# Patient Record
Sex: Female | Born: 1945 | Race: Black or African American | Hispanic: No | Smoking: Never smoker
Health system: Southern US, Community
[De-identification: ages and names within clinical notes are randomized; demographics above are authoritative.]

## PROBLEM LIST (undated history)

## (undated) DIAGNOSIS — J45909 Unspecified asthma, uncomplicated: Secondary | ICD-10-CM

## (undated) DIAGNOSIS — H409 Unspecified glaucoma: Secondary | ICD-10-CM

## (undated) DIAGNOSIS — T7840XA Allergy, unspecified, initial encounter: Secondary | ICD-10-CM

## (undated) DIAGNOSIS — I10 Essential (primary) hypertension: Secondary | ICD-10-CM

## (undated) DIAGNOSIS — E119 Type 2 diabetes mellitus without complications: Secondary | ICD-10-CM

## (undated) DIAGNOSIS — F419 Anxiety disorder, unspecified: Secondary | ICD-10-CM

## (undated) DIAGNOSIS — K219 Gastro-esophageal reflux disease without esophagitis: Secondary | ICD-10-CM

## (undated) HISTORY — DX: Gastro-esophageal reflux disease without esophagitis: K21.9

## (undated) HISTORY — PX: ABDOMINAL HYSTERECTOMY: SHX81

## (undated) HISTORY — DX: Unspecified glaucoma: H40.9

## (undated) HISTORY — PX: TUBAL LIGATION: SHX77

## (undated) HISTORY — DX: Anxiety disorder, unspecified: F41.9

## (undated) HISTORY — DX: Essential (primary) hypertension: I10

## (undated) HISTORY — DX: Allergy, unspecified, initial encounter: T78.40XA

## (undated) HISTORY — DX: Unspecified asthma, uncomplicated: J45.909

---

## 1997-10-19 ENCOUNTER — Ambulatory Visit (HOSPITAL_COMMUNITY): Admission: RE | Admit: 1997-10-19 | Discharge: 1997-10-19 | Payer: Self-pay | Admitting: Internal Medicine

## 1998-02-07 ENCOUNTER — Ambulatory Visit (HOSPITAL_COMMUNITY): Admission: RE | Admit: 1998-02-07 | Discharge: 1998-02-07 | Payer: Self-pay | Admitting: Internal Medicine

## 1998-08-24 ENCOUNTER — Other Ambulatory Visit: Admission: RE | Admit: 1998-08-24 | Discharge: 1998-08-24 | Payer: Self-pay | Admitting: Internal Medicine

## 1998-11-09 ENCOUNTER — Ambulatory Visit (HOSPITAL_COMMUNITY): Admission: RE | Admit: 1998-11-09 | Discharge: 1998-11-09 | Payer: Self-pay | Admitting: Gastroenterology

## 1998-12-01 ENCOUNTER — Ambulatory Visit (HOSPITAL_COMMUNITY): Admission: RE | Admit: 1998-12-01 | Discharge: 1998-12-01 | Payer: Self-pay | Admitting: Gastroenterology

## 1998-12-01 ENCOUNTER — Encounter: Payer: Self-pay | Admitting: Gastroenterology

## 1998-12-05 ENCOUNTER — Encounter: Payer: Self-pay | Admitting: Gastroenterology

## 1998-12-05 ENCOUNTER — Ambulatory Visit (HOSPITAL_COMMUNITY): Admission: RE | Admit: 1998-12-05 | Discharge: 1998-12-05 | Payer: Self-pay | Admitting: Gastroenterology

## 1998-12-11 ENCOUNTER — Ambulatory Visit (HOSPITAL_COMMUNITY): Admission: RE | Admit: 1998-12-11 | Discharge: 1998-12-11 | Payer: Self-pay | Admitting: Gastroenterology

## 1998-12-11 ENCOUNTER — Encounter: Payer: Self-pay | Admitting: Gastroenterology

## 1999-06-06 ENCOUNTER — Ambulatory Visit (HOSPITAL_COMMUNITY): Admission: RE | Admit: 1999-06-06 | Discharge: 1999-06-06 | Payer: Self-pay | Admitting: Gastroenterology

## 1999-06-06 ENCOUNTER — Encounter: Payer: Self-pay | Admitting: Gastroenterology

## 2000-01-10 ENCOUNTER — Emergency Department (HOSPITAL_COMMUNITY): Admission: EM | Admit: 2000-01-10 | Discharge: 2000-01-10 | Payer: Self-pay | Admitting: Emergency Medicine

## 2000-01-10 ENCOUNTER — Encounter: Payer: Self-pay | Admitting: Emergency Medicine

## 2000-06-07 ENCOUNTER — Ambulatory Visit (HOSPITAL_COMMUNITY): Admission: RE | Admit: 2000-06-07 | Discharge: 2000-06-07 | Payer: Self-pay | Admitting: Internal Medicine

## 2000-06-07 ENCOUNTER — Encounter: Payer: Self-pay | Admitting: Internal Medicine

## 2001-06-02 ENCOUNTER — Ambulatory Visit (HOSPITAL_COMMUNITY): Admission: RE | Admit: 2001-06-02 | Discharge: 2001-06-02 | Payer: Self-pay | Admitting: Gastroenterology

## 2001-06-02 ENCOUNTER — Encounter: Payer: Self-pay | Admitting: Gastroenterology

## 2001-12-25 ENCOUNTER — Emergency Department (HOSPITAL_COMMUNITY): Admission: EM | Admit: 2001-12-25 | Discharge: 2001-12-26 | Payer: Self-pay | Admitting: Emergency Medicine

## 2002-06-13 ENCOUNTER — Emergency Department (HOSPITAL_COMMUNITY): Admission: EM | Admit: 2002-06-13 | Discharge: 2002-06-13 | Payer: Self-pay | Admitting: Emergency Medicine

## 2002-06-18 ENCOUNTER — Emergency Department (HOSPITAL_COMMUNITY): Admission: EM | Admit: 2002-06-18 | Discharge: 2002-06-18 | Payer: Self-pay | Admitting: *Deleted

## 2002-06-22 ENCOUNTER — Encounter: Admission: RE | Admit: 2002-06-22 | Discharge: 2002-07-08 | Payer: Self-pay | Admitting: Orthopedic Surgery

## 2003-06-27 ENCOUNTER — Encounter: Admission: RE | Admit: 2003-06-27 | Discharge: 2003-06-27 | Payer: Self-pay | Admitting: Internal Medicine

## 2003-12-29 ENCOUNTER — Encounter: Admission: RE | Admit: 2003-12-29 | Discharge: 2003-12-29 | Payer: Self-pay | Admitting: Internal Medicine

## 2004-04-19 ENCOUNTER — Encounter: Admission: RE | Admit: 2004-04-19 | Discharge: 2004-07-18 | Payer: Self-pay | Admitting: Internal Medicine

## 2004-05-24 ENCOUNTER — Encounter: Admission: RE | Admit: 2004-05-24 | Discharge: 2004-05-24 | Payer: Self-pay | Admitting: Internal Medicine

## 2004-06-04 ENCOUNTER — Inpatient Hospital Stay (HOSPITAL_COMMUNITY): Admission: RE | Admit: 2004-06-04 | Discharge: 2004-06-06 | Payer: Self-pay | Admitting: Neurosurgery

## 2005-01-23 ENCOUNTER — Ambulatory Visit (HOSPITAL_COMMUNITY): Admission: RE | Admit: 2005-01-23 | Discharge: 2005-01-23 | Payer: Self-pay | Admitting: Gastroenterology

## 2005-01-23 ENCOUNTER — Encounter (INDEPENDENT_AMBULATORY_CARE_PROVIDER_SITE_OTHER): Payer: Self-pay | Admitting: Specialist

## 2005-07-02 ENCOUNTER — Encounter: Admission: RE | Admit: 2005-07-02 | Discharge: 2005-07-02 | Payer: Self-pay | Admitting: Internal Medicine

## 2007-03-16 ENCOUNTER — Encounter: Admission: RE | Admit: 2007-03-16 | Discharge: 2007-03-16 | Payer: Self-pay | Admitting: Internal Medicine

## 2008-03-16 ENCOUNTER — Encounter: Admission: RE | Admit: 2008-03-16 | Discharge: 2008-03-16 | Payer: Self-pay | Admitting: Internal Medicine

## 2008-03-17 ENCOUNTER — Encounter: Admission: RE | Admit: 2008-03-17 | Discharge: 2008-03-17 | Payer: Self-pay | Admitting: Internal Medicine

## 2008-03-29 ENCOUNTER — Encounter: Admission: RE | Admit: 2008-03-29 | Discharge: 2008-03-29 | Payer: Self-pay | Admitting: Internal Medicine

## 2009-04-06 ENCOUNTER — Encounter: Admission: RE | Admit: 2009-04-06 | Discharge: 2009-04-06 | Payer: Self-pay | Admitting: Internal Medicine

## 2009-12-19 ENCOUNTER — Encounter: Admission: RE | Admit: 2009-12-19 | Discharge: 2009-12-19 | Payer: Self-pay | Admitting: Internal Medicine

## 2010-01-29 ENCOUNTER — Encounter: Admission: RE | Admit: 2010-01-29 | Discharge: 2010-01-29 | Payer: Self-pay | Admitting: Internal Medicine

## 2010-04-09 ENCOUNTER — Encounter: Admission: RE | Admit: 2010-04-09 | Discharge: 2010-04-09 | Payer: Self-pay | Admitting: Internal Medicine

## 2010-08-03 ENCOUNTER — Other Ambulatory Visit: Payer: Self-pay | Admitting: Internal Medicine

## 2010-08-03 ENCOUNTER — Ambulatory Visit
Admission: RE | Admit: 2010-08-03 | Discharge: 2010-08-03 | Disposition: A | Discharge status: Home / Self Care | Payer: BC Managed Care – PPO | Source: Ambulatory Visit | Attending: Internal Medicine | Admitting: Internal Medicine

## 2010-08-03 DIAGNOSIS — M25551 Pain in right hip: Secondary | ICD-10-CM

## 2010-08-03 DIAGNOSIS — W19XXXA Unspecified fall, initial encounter: Secondary | ICD-10-CM

## 2010-10-05 NOTE — Op Note (Signed)
NAME:  Katherine Mckee, Katherine Mckee                 ACCOUNT NO.:  [id number]   MEDICAL RECORD NO.:  [id number]          PATIENT TYPE:  AMB   LOCATION:  ENDO                         FACILITY:  [facility]   PHYSICIAN:  Anselmo Rod, M.D.  DATE OF BIRTH:  04-03-1946   DATE OF PROCEDURE:  01/23/2005  DATE OF DISCHARGE:                                 OPERATIVE REPORT   PROCEDURE PERFORMED:  Colonoscopy with cold biopsies x3.   ENDOSCOPIST:  Anselmo Rod, M.D.   INSTRUMENT USED:  Olympus video colonoscope.   INDICATION FOR PROCEDURE:  A 65 year old African-American female undergoing  screening colonoscopy to rule out colonic polyps, masses, etc.   PREPROCEDURE PREPARATION:  Informed consent was procured from the patient.  The patient was fasted for eight hours prior to the procedure and prepped  with a bottle of magnesium citrate and a gallon of GoLYTELY the night prior  to the procedure.  The risks and benefits of the procedure, including a 10%  miss rate of cancer and polyps, were discussed with the patient as well.   PREPROCEDURE PHYSICAL:  VITAL SIGNS:  The patient had stable vital signs.  NECK:  Supple.  CHEST:  Clear to auscultation.  S1, S2 regular.  ABDOMEN:  Soft with normal bowel sounds.   DESCRIPTION OF PROCEDURE:  The patient was placed in the left lateral  decubitus position and sedated with 100 mg of Demerol and 10 mg of Versed in  slow incremental doses.  Once the patient was adequately sedate and  maintained on low-flow oxygen and continuous cardiac monitoring, the Olympus  video colonoscope was advanced from the rectum to the cecum.  The  appendiceal orifice and ileocecal valve were visualized and photographed.  There were sigmoid diverticula seen with small internal hemorrhoids noted on  retroflexion.  Two small sessile polyps were biopsied from the anal verge.  The rest of the exam was unremarkable.  The cecum, right colon and  transverse colon appeared normal.   IMPRESSION:  1.  Small, nonbleeding internal hemorrhoid.  2.  Early sigmoid diverticulosis.  3.  Two small sessile polyps biopsied (cold biopsies x2) from the anal      verge.   RECOMMENDATIONS:  1.  Await pathology results.  2.  Avoid all nonsteroidals including aspirin for the next two weeks.  3.  Repeat colonoscopy depending upon pathology results.  4.  Outpatient follow-up as need arises in the future.      Anselmo Rod, M.D.  Electronically Signed     JNM/MEDQ  D:  01/23/2005  T:  01/23/2005  Job:  [id number]   cc:   Merlene Laughter. Renae Gloss, M.D.  [address]  [address]  [place]  [place] [address]  Fax: [phone]

## 2010-10-05 NOTE — Op Note (Signed)
NAME:  Katherine Mckee, Katherine Mckee NO.:  [id number]   MEDICAL RECORD NO.:  [id number]          PATIENT TYPE:  OIB   LOCATION:  3172                         FACILITY:  [facility]   PHYSICIAN:  Cristi Loron, M.D.DATE OF BIRTH:  04-13-1946   DATE OF PROCEDURE:  06/04/2004  DATE OF DISCHARGE:                                 OPERATIVE REPORT   PREOPERATIVE DIAGNOSES:  Left L5-S1 herniated nucleus pulposus, spinal  stenosis, lumbar radiculopathy, with lumbago.   POSTOPERATIVE DIAGNOSES:  Left L5-S1 herniated nucleus pulposus, spinal  stenosis, lumbar radiculopathy, with lumbago.   PROCEDURE:  Left L5-S1 microdiskectomy using microdissection.   SURGEON:  Cristi Loron, M.D.   ASSISTANT:  Hewitt Shorts, M.D.   ANESTHESIA:  General endotracheal.   ESTIMATED BLOOD LOSS:  50 mL.   SPECIMENS:  None.   DRAINS:  None.   COMPLICATIONS:  None.   BRIEF HISTORY:  The patient is a 65 year old black female who has suffered  from back and left leg pain.  She has failed medical management and worked  up with a lumbar MRI, which demonstrated herniated disk at L5-S1 on the  left.  The patient's signs and symptoms and physical exam are consistent  with a left S1 radiculopathy.  I discussed the various treatment options  with the patient, including surgery.  The patient has weighed the risks,  benefits, and alternatives of surgery and decided to proceed with a left L5-  S1 microdiskectomy.   DESCRIPTION OF PROCEDURE:  The patient was brought to the operating room by  the anesthesia, team, general endotracheal anesthesia was induced.  The  patient was turned to the prone position on the Wilson frame.  Her  lumbosacral region was then prepared with Betadine scrub and Betadine  solution and sterile drapes were applied.  I then injected the area to be  incised with Marcaine with epinephrine solution and used a scalpel to make a  linear midline incision over the L5-S1 disk space.   I then used  electrocautery to perform a subperiosteal dissection, exposing the left  spinous processes and laminae of L5 and the upper sacrum.  I then obtained  the intraoperative radiograph to confirm our location.   We then brought the [facility] retractor, we then placed the [facility]  retractor in for wound exposure and then brought the operating microscope  into the field and under its magnification and illumination completed a  microdissection/decompression.  We used the high-speed drill to perform a  left L5 laminotomy.  We widened the laminotomy with the Kerrison punch and  removed the left L5-S1 ligamentum flavum.  We performed a foraminotomy about  the left S1 nerve root.  We then used microdissection to free up the thecal  sac and the left S1 nerve root from the epidural tissue, and then Dr.  Newell Coral gently retracted the nerve root and thecal sac medially with a  D'Errico retractor.  This exposed a disk herniation which had migrated in a  cephalad direction over the caudal aspect of the L5 vertebral body.  We  removed it in several  fragments using pituitary forceps.  We then inspected  the intervertebral disk.  There was quite a bit of bulging at the disk space  with some compression of the S1 nerve root.  We therefore incised the  annulus and then performed a partial intervertebral diskectomy using the  pituitary forceps, the Epstein and Scoville curettes.  When we were  satisfied with the diskectomy, we then used the osteophyte tool to remove  some redundant ligament from the vertebral end plates at Z6-X0 on the left.  We then palpated along the ventral surface of the thecal sac, along the exit  route of the left S1 nerve root, and noted the neural structures were well-  decompressed.  We then obtained stringent hemostasis using bipolar  electrocautery and copiously irrigated the wound out with bacitracin  solution, removed the solution, then removed the retractor.  We  then  reapproximated the patient's thoracolumbar fascia with interrupted #1 Vicryl  suture, the subcutaneous tissue with interrupted 2-0 Vicryl suture, and the  skin with Steri-Strips and Benzoin.  The wound was then coated with  bacitracin ointment, sterile dressing applied, the drapes were removed, and  the patient was subsequently returned to the supine position, where she was  extubated by the anesthesia team and transported to the postanesthesia care  unit in stable condition.  All sponge, instrument, and needle counts were  correct at the end of this case.      JDJ/MEDQ  D:  06/04/2004  T:  06/04/2004  Job:  [id number]

## 2011-04-25 ENCOUNTER — Other Ambulatory Visit: Payer: Self-pay | Admitting: Internal Medicine

## 2011-04-25 DIAGNOSIS — Z1231 Encounter for screening mammogram for malignant neoplasm of breast: Secondary | ICD-10-CM

## 2011-06-05 ENCOUNTER — Ambulatory Visit: Payer: BC Managed Care – PPO

## 2011-07-30 ENCOUNTER — Ambulatory Visit
Admission: RE | Admit: 2011-07-30 | Discharge: 2011-07-30 | Disposition: A | Discharge status: Home / Self Care | Payer: BC Managed Care – PPO | Source: Ambulatory Visit | Attending: Internal Medicine | Admitting: Internal Medicine

## 2011-07-30 DIAGNOSIS — Z1231 Encounter for screening mammogram for malignant neoplasm of breast: Secondary | ICD-10-CM

## 2012-03-30 ENCOUNTER — Ambulatory Visit (INDEPENDENT_AMBULATORY_CARE_PROVIDER_SITE_OTHER): Payer: BC Managed Care – PPO | Admitting: Family Medicine

## 2012-03-30 VITALS — BP 102/70 | HR 75 | Temp 97.8°F | Resp 16 | Ht 67.0 in | Wt 178.0 lb

## 2012-03-30 DIAGNOSIS — J45901 Unspecified asthma with (acute) exacerbation: Secondary | ICD-10-CM

## 2012-03-30 MED ORDER — ALBUTEROL SULFATE (2.5 MG/3ML) 0.083% IN NEBU: 2.5000 mg | INHALATION_SOLUTION | Freq: Four times a day (QID) | RESPIRATORY_TRACT | Status: DC | PRN

## 2012-03-30 MED ORDER — ALBUTEROL SULFATE HFA 108 (90 BASE) MCG/ACT IN AERS: 2.0000 | INHALATION_SPRAY | RESPIRATORY_TRACT | Status: DC | PRN

## 2012-03-30 MED ORDER — PREDNISONE 20 MG PO TABS: 40.0000 mg | ORAL_TABLET | Freq: Every day | ORAL | Status: DC

## 2012-03-30 MED ORDER — PROMETHAZINE-CODEINE 6.25-10 MG/5ML PO SYRP: 5.0000 mL | ORAL_SOLUTION | ORAL | Status: DC | PRN

## 2012-03-30 NOTE — Progress Notes (Signed)
  Subjective:    Patient ID: Katherine Mckee, female    DOB: January 10, 1946, 66 y.o.   MRN: [id number]  HPI  Gets seasonal bronchitis and asthma and worked this wkend so is really drained. Has nebulizer at home but alb had expired and has been using pro-air inhaler using tid. Was on flovent once, some otc tussi made it worse, taking singulair. No f/c SHoB and wheezing, dry cough, chest tightness.   Past Medical History  Diagnosis Date  . Asthma   . Glaucoma   . Anxiety      Review of Systems  Constitutional: Positive for activity change and fatigue. Negative for fever, chills and diaphoresis.  HENT: Positive for congestion. Negative for sore throat, rhinorrhea, sneezing and postnasal drip.   Respiratory: Positive for cough, chest tightness, shortness of breath and wheezing. Negative for stridor.   Cardiovascular: Negative for chest pain.  Gastrointestinal: Negative for nausea and vomiting.  Skin: Negative for rash.  Hematological: Negative for adenopathy.  Psychiatric/Behavioral: Positive for sleep disturbance.      BP 102/70  Pulse 75  Temp 97.8 F (36.6 C) (Oral)  Resp 16  Ht 5\' 7"  (1.702 m)  Wt 178 lb (80.74 kg)  BMI 27.88 kg/m2  SpO2 97% Objective:   Physical Exam  Constitutional: She is oriented to person, place, and time. She appears well-developed and well-nourished. No distress.  HENT:  Head: Normocephalic and atraumatic.  Right Ear: Tympanic membrane, external ear and ear canal normal.  Left Ear: Tympanic membrane, external ear and ear canal normal.  Nose: Nose normal. No mucosal edema or rhinorrhea.  Mouth/Throat: Uvula is midline, oropharynx is clear and moist and mucous membranes are normal. No oropharyngeal exudate.  Eyes: Conjunctivae normal are normal. Right eye exhibits no discharge. Left eye exhibits no discharge. No scleral icterus.  Neck: Normal range of motion. Neck supple.  Cardiovascular: Normal rate, regular rhythm, normal heart sounds and intact distal  pulses.   Pulmonary/Chest: Effort normal and breath sounds normal. No accessory muscle usage. No respiratory distress. She has no decreased breath sounds. She has no wheezes.       Recurrent dry cough throughout pulm exam but no other abnormalities  Lymphadenopathy:    She has no cervical adenopathy.  Neurological: She is alert and oriented to person, place, and time.  Skin: Skin is warm and dry. She is not diaphoretic. No erythema.  Psychiatric: She has a normal mood and affect. Her behavior is normal.          Assessment & Plan:   1. Asthma exacerbation  albuterol (PROVENTIL) (2.5 MG/3ML) 0.083% nebulizer solution, albuterol (PROVENTIL HFA;VENTOLIN HFA) 108 (90 BASE) MCG/ACT inhaler, predniSONE (DELTASONE) 20 MG tablet, promethazine-codeine (PHENERGAN WITH CODEINE) 6.25-10 MG/5ML syrup

## 2012-09-17 ENCOUNTER — Other Ambulatory Visit: Payer: Self-pay

## 2012-09-17 DIAGNOSIS — Z1231 Encounter for screening mammogram for malignant neoplasm of breast: Secondary | ICD-10-CM

## 2012-10-26 ENCOUNTER — Ambulatory Visit: Payer: BC Managed Care – PPO

## 2012-10-26 ENCOUNTER — Ambulatory Visit
Admission: RE | Admit: 2012-10-26 | Discharge: 2012-10-26 | Disposition: A | Discharge status: Home / Self Care | Payer: BC Managed Care – PPO | Source: Ambulatory Visit

## 2012-10-26 DIAGNOSIS — Z1231 Encounter for screening mammogram for malignant neoplasm of breast: Secondary | ICD-10-CM

## 2013-10-22 ENCOUNTER — Other Ambulatory Visit: Payer: Self-pay

## 2013-10-22 DIAGNOSIS — Z1231 Encounter for screening mammogram for malignant neoplasm of breast: Secondary | ICD-10-CM

## 2013-11-17 ENCOUNTER — Encounter (INDEPENDENT_AMBULATORY_CARE_PROVIDER_SITE_OTHER): Payer: Self-pay

## 2013-11-17 ENCOUNTER — Ambulatory Visit
Admission: RE | Admit: 2013-11-17 | Discharge: 2013-11-17 | Disposition: A | Discharge status: Home / Self Care | Payer: BC Managed Care – PPO | Source: Ambulatory Visit

## 2013-11-17 DIAGNOSIS — Z1231 Encounter for screening mammogram for malignant neoplasm of breast: Secondary | ICD-10-CM

## 2013-12-31 ENCOUNTER — Telehealth: Payer: Self-pay | Admitting: Medical

## 2013-12-31 NOTE — Telephone Encounter (Signed)
Records received from [facility]. Sending back for review.

## 2014-01-06 ENCOUNTER — Ambulatory Visit (INDEPENDENT_AMBULATORY_CARE_PROVIDER_SITE_OTHER): Payer: BC Managed Care – PPO | Admitting: Family Medicine

## 2014-01-06 VITALS — BP 120/68 | HR 82 | Temp 98.0°F | Resp 18 | Ht 66.5 in | Wt 174.4 lb

## 2014-01-06 DIAGNOSIS — J45901 Unspecified asthma with (acute) exacerbation: Secondary | ICD-10-CM

## 2014-01-06 MED ORDER — ALBUTEROL SULFATE (2.5 MG/3ML) 0.083% IN NEBU
5.0000 mg | INHALATION_SOLUTION | Freq: Once | RESPIRATORY_TRACT | Status: AC
  Administered 2014-01-06: 5 mg via RESPIRATORY_TRACT

## 2014-01-06 MED ORDER — GUAIFENESIN-CODEINE 200-20 MG/5ML PO LIQD: 5.0000 mL | ORAL | Status: DC | PRN

## 2014-01-06 MED ORDER — AZITHROMYCIN 250 MG PO TABS: ORAL_TABLET | ORAL | Status: DC

## 2014-01-06 MED ORDER — ALBUTEROL SULFATE (2.5 MG/3ML) 0.083% IN NEBU: 2.5000 mg | INHALATION_SOLUTION | Freq: Four times a day (QID) | RESPIRATORY_TRACT | Status: AC | PRN

## 2014-01-06 MED ORDER — METHYLPREDNISOLONE SODIUM SUCC 125 MG IJ SOLR
125.0000 mg | Freq: Once | INTRAMUSCULAR | Status: AC
  Administered 2014-01-06: 125 mg via INTRAMUSCULAR

## 2014-01-06 MED ORDER — ALBUTEROL SULFATE HFA 108 (90 BASE) MCG/ACT IN AERS: 2.0000 | INHALATION_SPRAY | RESPIRATORY_TRACT | Status: DC | PRN

## 2014-01-06 MED ORDER — PREDNISONE 20 MG PO TABS: 40.0000 mg | ORAL_TABLET | Freq: Every day | ORAL | Status: DC

## 2014-01-06 NOTE — Progress Notes (Signed)
Subjective:    Patient ID: Katherine Mckee, female    DOB: 1946-04-01, 68 y.o.   MRN: [id number] Chief Complaint  Patient presents with  . Asthma    X 5 days    HPI  Her nebulizer machine is broken - has been going through [employer]. Fax # [phone] but now machine broke and she is having trouble getting it replaced  Has had a flaire of asthma with the humidity inccreasing.  No f/c. Dry cough, keeping her up at night. Still on singulair.  Her PCP Dr. Karlton Lemon is in process of changing practices so told her to come here.   Past Medical History  Diagnosis Date  . Asthma   . Glaucoma   . Anxiety   . Allergy   . GERD (gastroesophageal reflux disease)   . Hypertension    Current Outpatient Prescriptions on File Prior to Visit  Medication Sig Dispense Refill  . albuterol (PROVENTIL HFA;VENTOLIN HFA) 108 (90 BASE) MCG/ACT inhaler Inhale 2 puffs into the lungs every 4 (four) hours as needed for wheezing (cough, shortness of breath or wheezing.).  1 Inhaler  11  . albuterol (PROVENTIL) (2.5 MG/3ML) 0.083% nebulizer solution Take 3 mLs (2.5 mg total) by nebulization every 6 (six) hours as needed.  75 mL  11  . ALPRAZolam (XANAX) 0.5 MG tablet Take 0.5 mg by mouth 2 (two) times daily.      . montelukast (SINGULAIR) 10 MG tablet Take 10 mg by mouth at bedtime.      . bimatoprost (LUMIGAN) 0.01 % SOLN 1 drop at bedtime.      Marland Kitchen olmesartan (BENICAR) 5 MG tablet Take 10 mg by mouth daily.       No current facility-administered medications on file prior to visit.   Allergies  Allergen Reactions  . Sulfa Antibiotics      Review of Systems  Constitutional: Positive for fatigue. Negative for fever, chills, diaphoresis and activity change.  HENT: Negative for congestion, rhinorrhea and sore throat.   Respiratory: Positive for cough, chest tightness, shortness of breath and wheezing.   Cardiovascular: Negative for chest pain, palpitations and leg swelling.  Musculoskeletal: Negative for  arthralgias and back pain.  Hematological: Negative for adenopathy.  Psychiatric/Behavioral: Positive for sleep disturbance.       Objective:  BP 120/68  Pulse 82  Temp(Src) 98 F (36.7 C) (Oral)  Resp 18  Ht 5' 6.5" (1.689 m)  Wt 174 lb 6.4 oz (79.107 kg)  BMI 27.73 kg/m2  SpO2 96%  Physical Exam  Constitutional: She is oriented to person, place, and time. She appears well-developed and well-nourished. No distress.  HENT:  Head: Normocephalic and atraumatic.  Right Ear: Tympanic membrane, external ear and ear canal normal.  Left Ear: Tympanic membrane, external ear and ear canal normal.  Nose: Nose normal. No mucosal edema or rhinorrhea.  Mouth/Throat: Uvula is midline, oropharynx is clear and moist and mucous membranes are normal. No oropharyngeal exudate.  Eyes: Conjunctivae are normal. Right eye exhibits no discharge. Left eye exhibits no discharge. No scleral icterus.  Neck: Normal range of motion. Neck supple.  Cardiovascular: Normal rate, regular rhythm, normal heart sounds and intact distal pulses.   Pulmonary/Chest: Effort normal. No accessory muscle usage. Not tachypneic. No respiratory distress. She has decreased breath sounds. She has no wheezes.  Hacking dry cough with all inhalation, cough freq decreased some after alb neg treatments and breath sounds improved.  Lymphadenopathy:    She has no cervical adenopathy.  Neurological:  She is alert and oriented to person, place, and time.  Skin: Skin is warm and dry. She is not diaphoretic. No erythema.  Psychiatric: She has a normal mood and affect. Her behavior is normal.       Assessment & Plan:   Asthma exacerbation - Plan: albuterol (PROVENTIL HFA;VENTOLIN HFA) 108 (90 BASE) MCG/ACT inhaler, albuterol (PROVENTIL) (2.5 MG/3ML) 0.083% nebulizer solution, albuterol (PROVENTIL) (2.5 MG/3ML) 0.083% nebulizer solution 5 mg, methylPREDNISolone sodium succinate (SOLU-MEDROL) 125 mg/2 mL injection 125 mg Needs neb machine -  some improvement after alb neb x 2 in office - will send pt to [facility] - better service w/ them in past.  IM Solumedrol 125 x 1 now in office and start po prednisone tomorrow. SNAP rx for zpack given in case sxs worsen - start immed. RTC if sxs cont/recur. Meds ordered this encounter  Medications  . valsartan (DIOVAN) 320 MG tablet    Sig: Take 320 mg by mouth daily.  Marland Kitchen latanoprost (XALATAN) 0.005 % ophthalmic solution    Sig: 1 drop at bedtime.  . DORZOLAMIDE HCL OP    Sig: Apply to eye.  Marland Kitchen aspirin 81 MG tablet    Sig: Take 81 mg by mouth daily.  . Fish Oil-Cholecalciferol (FISH OIL + D3) 1000-1000 MG-UNIT CAPS    Sig: Take by mouth.  . calcium carbonate 200 MG capsule    Sig: Take 250 mg by mouth 2 (two) times daily with a meal.  . ibuprofen (ADVIL,MOTRIN) 200 MG tablet    Sig: Take 200 mg by mouth every 6 (six) hours as needed.  Marland Kitchen omeprazole (PRILOSEC) 10 MG capsule    Sig: Take 10 mg by mouth daily.  Marland Kitchen VITAMIN D, CHOLECALCIFEROL, PO    Sig: Take by mouth.  Marland Kitchen albuterol (PROVENTIL HFA;VENTOLIN HFA) 108 (90 BASE) MCG/ACT inhaler    Sig: Inhale 2 puffs into the lungs every 4 (four) hours as needed for wheezing (cough, shortness of breath or wheezing.).    Dispense:  1 Inhaler    Refill:  11  . albuterol (PROVENTIL) (2.5 MG/3ML) 0.083% nebulizer solution    Sig: Take 3 mLs (2.5 mg total) by nebulization every 6 (six) hours as needed.    Dispense:  75 mL    Refill:  11  . predniSONE (DELTASONE) 20 MG tablet    Sig: Take 2 tablets (40 mg total) by mouth daily with breakfast.    Dispense:  10 tablet    Refill:  0  . Guaifenesin-Codeine 200-20 MG/5ML LIQD    Sig: Take 5 mLs by mouth every 4 (four) hours as needed.    Dispense:  180 mL    Refill:  0  . albuterol (PROVENTIL) (2.5 MG/3ML) 0.083% nebulizer solution 5 mg    Sig:   . azithromycin (ZITHROMAX) 250 MG tablet    Sig: Take 2 tabs PO x 1 dose, then 1 tab PO QD x 4 days    Dispense:  6 tablet    Refill:  0  .  methylPREDNISolone sodium succinate (SOLU-MEDROL) 125 mg/2 mL injection 125 mg    Sig:     Delman Cheadle, MD MPH

## 2014-01-06 NOTE — Patient Instructions (Signed)
Now that we have you on prednisone, your asthma should just keep on getting better. If at any point you start feeling worse or get any fever, chills, worse cough, or coughing up anything, go ahead and start the antibiotic. Asthma, Acute Bronchospasm Acute bronchospasm caused by asthma is also referred to as an asthma attack. Bronchospasm means your air passages become narrowed. The narrowing is caused by inflammation and tightening of the muscles in the air tubes (bronchi) in your lungs. This can make it hard to breathe or cause you to wheeze and cough. CAUSES Possible triggers are:  Animal dander from the skin, hair, or feathers of animals.  Dust mites contained in house dust.  Cockroaches.  Pollen from trees or grass.  Mold.  Cigarette or tobacco smoke.  Air pollutants such as dust, household cleaners, hair sprays, aerosol sprays, paint fumes, strong chemicals, or strong odors.  Cold air or weather changes. Cold air may trigger inflammation. Winds increase molds and pollens in the air.  Strong emotions such as crying or laughing hard.  Stress.  Certain medicines such as aspirin or beta-blockers.  Sulfites in foods and drinks, such as dried fruits and wine.  Infections or inflammatory conditions, such as a flu, cold, or inflammation of the nasal membranes (rhinitis).  Gastroesophageal reflux disease (GERD). GERD is a condition where stomach acid backs up into your esophagus.  Exercise or strenuous activity. SIGNS AND SYMPTOMS   Wheezing.  Excessive coughing, particularly at night.  Chest tightness.  Shortness of breath. DIAGNOSIS  Your health care provider will ask you about your medical history and perform a physical exam. A chest X-ray or blood testing may be performed to look for other causes of your symptoms or other conditions that may have triggered your asthma attack. TREATMENT  Treatment is aimed at reducing inflammation and opening up the airways in your  lungs. Most asthma attacks are treated with inhaled medicines. These include quick relief or rescue medicines (such as bronchodilators) and controller medicines (such as inhaled corticosteroids). These medicines are sometimes given through an inhaler or a nebulizer. Systemic steroid medicine taken by mouth or given through an IV tube also can be used to reduce the inflammation when an attack is moderate or severe. Antibiotic medicines are only used if a bacterial infection is present.  HOME CARE INSTRUCTIONS   Rest.  Drink plenty of liquids. This helps the mucus to remain thin and be easily coughed up. Only use caffeine in moderation and do not use alcohol until you have recovered from your illness.  Do not smoke. Avoid being exposed to secondhand smoke.  You play a critical role in keeping yourself in good health. Avoid exposure to things that cause you to wheeze or to have breathing problems.  Keep your medicines up-to-date and available. Carefully follow your health care provider's treatment plan.  Take your medicine exactly as prescribed.  When pollen or pollution is bad, keep windows closed and use an air conditioner or go to places with air conditioning.  Asthma requires careful medical care. See your health care provider for a follow-up as advised. If you are more than [redacted] weeks pregnant and you were prescribed any new medicines, let your obstetrician know about the visit and how you are doing. Follow up with your health care provider as directed.  After you have recovered from your asthma attack, make an appointment with your outpatient doctor to talk about ways to reduce the likelihood of future attacks. If you do not  have a doctor who manages your asthma, make an appointment with a primary care doctor to discuss your asthma. SEEK IMMEDIATE MEDICAL CARE IF:   You are getting worse.  You have trouble breathing. If severe, call your local emergency services (911 in the [place]  You  develop chest pain or discomfort.  You are vomiting.  You are not able to keep fluids down.  You are coughing up yellow, green, brown, or bloody sputum.  You have a fever and your symptoms suddenly get worse.  You have trouble swallowing. MAKE SURE YOU:   Understand these instructions.  Will watch your condition.  Will get help right away if you are not doing well or get worse. Document Released: 08/21/2006 Document Revised: 05/11/2013 Document Reviewed: 11/11/2012 [facility] Patient Information 2015 [place], [place]. This information is not intended to replace advice given to you by your health care provider. Make sure you discuss any questions you have with your health care provider. Asthma Attack Prevention Although there is no way to prevent asthma from starting, you can take steps to control the disease and reduce its symptoms. Learn about your asthma and how to control it. Take an active role to control your asthma by working with your health care provider to create and follow an asthma action plan. An asthma action plan guides you in:  Taking your medicines properly.  Avoiding things that set off your asthma or make your asthma worse (asthma triggers).  Tracking your level of asthma control.  Responding to worsening asthma.  Seeking emergency care when needed. To track your asthma, keep records of your symptoms, check your peak flow number using a handheld device that shows how well air moves out of your lungs (peak flow meter), and get regular asthma checkups.  WHAT ARE SOME WAYS TO PREVENT AN ASTHMA ATTACK?  Take medicines as directed by your health care provider.  Keep track of your asthma symptoms and level of control.  With your health care provider, write a detailed plan for taking medicines and managing an asthma attack. Then be sure to follow your action plan. Asthma is an ongoing condition that needs regular monitoring and treatment.  Identify and avoid asthma  triggers. Many outdoor allergens and irritants (such as pollen, mold, cold air, and air pollution) can trigger asthma attacks. Find out what your asthma triggers are and take steps to avoid them.  Monitor your breathing. Learn to recognize warning signs of an attack, such as coughing, wheezing, or shortness of breath. Your lung function may decrease before you notice any signs or symptoms, so regularly measure and record your peak airflow with a home peak flow meter.  Identify and treat attacks early. If you act quickly, you are less likely to have a severe attack. You will also need less medicine to control your symptoms. When your peak flow measurements decrease and alert you to an upcoming attack, take your medicine as instructed and immediately stop any activity that may have triggered the attack. If your symptoms do not improve, get medical help.  Pay attention to increasing quick-relief inhaler use. If you find yourself relying on your quick-relief inhaler, your asthma is not under control. See your health care provider about adjusting your treatment. WHAT CAN MAKE MY SYMPTOMS WORSE? A number of common things can set off or make your asthma symptoms worse and cause temporary increased inflammation of your airways. Keep track of your asthma symptoms for several weeks, detailing all the environmental and emotional factors that  are linked with your asthma. When you have an asthma attack, go back to your asthma diary to see which factor, or combination of factors, might have contributed to it. Once you know what these factors are, you can take steps to control many of them. If you have allergies and asthma, it is important to take asthma prevention steps at home. Minimizing contact with the substance to which you are allergic will help prevent an asthma attack. Some triggers and ways to avoid these triggers are: Animal Dander:  Some people are allergic to the flakes of skin or dried saliva from animals  with fur or feathers.   There is no such thing as a hypoallergenic dog or cat breed. All dogs or cats can cause allergies, even if they don't shed.  Keep these pets out of your home.  If you are not able to keep a pet outdoors, keep the pet out of your bedroom and other sleeping areas at all times, and keep the door closed.  Remove carpets and furniture covered with cloth from your home. If that is not possible, keep the pet away from fabric-covered furniture and carpets. Dust Mites: Many people with asthma are allergic to dust mites. Dust mites are tiny bugs that are found in every home in mattresses, pillows, carpets, fabric-covered furniture, bedcovers, clothes, stuffed toys, and other fabric-covered items.   Cover your mattress in a special dust-proof cover.  Cover your pillow in a special dust-proof cover, or wash the pillow each week in hot water. Water must be hotter than 130 F (54.4 C) to kill dust mites. Cold or warm water used with detergent and bleach can also be effective.  Wash the sheets and blankets on your bed each week in hot water.  Try not to sleep or lie on cloth-covered cushions.  Call ahead when traveling and ask for a smoke-free hotel room. Bring your own bedding and pillows in case the hotel only supplies feather pillows and down comforters, which may contain dust mites and cause asthma symptoms.  Remove carpets from your bedroom and those laid on concrete, if you can.  Keep stuffed toys out of the bed, or wash the toys weekly in hot water or cooler water with detergent and bleach. Cockroaches: Many people with asthma are allergic to the droppings and remains of cockroaches.   Keep food and garbage in closed containers. Never leave food out.  Use poison baits, traps, powders, gels, or paste (for example, boric acid).  If a spray is used to kill cockroaches, stay out of the room until the odor goes away. Indoor Mold:  Fix leaky faucets, pipes, or other  sources of water that have mold around them.  Clean floors and moldy surfaces with a fungicide or diluted bleach.  Avoid using humidifiers, vaporizers, or swamp coolers. These can spread molds through the air. Pollen and Outdoor Mold:  When pollen or mold spore counts are high, try to keep your windows closed.  Stay indoors with windows closed from late morning to afternoon. Pollen and some mold spore counts are highest at that time.  Ask your health care provider whether you need to take anti-inflammatory medicine or increase your dose of the medicine before your allergy season starts. Other Irritants to Avoid:  Tobacco smoke is an irritant. If you smoke, ask your health care provider how you can quit. Ask family members to quit smoking, too. Do not allow smoking in your home or car.  If possible, do not  use a wood-burning stove, kerosene heater, or fireplace. Minimize exposure to all sources of smoke, including incense, candles, fires, and fireworks.  Try to stay away from strong odors and sprays, such as perfume, talcum powder, hair spray, and paints.  Decrease humidity in your home and use an indoor air cleaning device. Reduce indoor humidity to below 60%. Dehumidifiers or central air conditioners can do this.  Decrease house dust exposure by changing furnace and air cooler filters frequently.  Try to have someone else vacuum for you once or twice a week. Stay out of rooms while they are being vacuumed and for a short while afterward.  If you vacuum, use a dust mask from a hardware store, a double-layered or microfilter vacuum cleaner bag, or a vacuum cleaner with a HEPA filter.  Sulfites in foods and beverages can be irritants. Do not drink beer or wine or eat dried fruit, processed potatoes, or shrimp if they cause asthma symptoms.  Cold air can trigger an asthma attack. Cover your nose and mouth with a scarf on cold or windy days.  Several health conditions can make asthma more  difficult to manage, including a runny nose, sinus infections, reflux disease, psychological stress, and sleep apnea. Work with your health care provider to manage these conditions.  Avoid close contact with people who have a respiratory infection such as a cold or the flu, since your asthma symptoms may get worse if you catch the infection. Wash your hands thoroughly after touching items that may have been handled by people with a respiratory infection.  Get a flu shot every year to protect against the flu virus, which often makes asthma worse for days or weeks. Also get a pneumonia shot if you have not previously had one. Unlike the flu shot, the pneumonia shot does not need to be given yearly. Medicines:  Talk to your health care provider about whether it is safe for you to take aspirin or non-steroidal anti-inflammatory medicines (NSAIDs). In a small number of people with asthma, aspirin and NSAIDs can cause asthma attacks. These medicines must be avoided by people who have known aspirin-sensitive asthma. It is important that people with aspirin-sensitive asthma read labels of all over-the-counter medicines used to treat pain, colds, coughs, and fever.  Beta-blockers and ACE inhibitors are other medicines you should discuss with your health care provider. HOW CAN I FIND OUT WHAT I AM ALLERGIC TO? Ask your asthma health care provider about allergy skin testing or blood testing (the RAST test) to identify the allergens to which you are sensitive. If you are found to have allergies, the most important thing to do is to try to avoid exposure to any allergens that you are sensitive to as much as possible. Other treatments for allergies, such as medicines and allergy shots (immunotherapy) are available.  CAN I EXERCISE? Follow your health care provider's advice regarding asthma treatment before exercising. It is important to maintain a regular exercise program, but vigorous exercise or exercise in cold,  humid, or dry environments can cause asthma attacks, especially for those people who have exercise-induced asthma. Document Released: 04/24/2009 Document Revised: 05/11/2013 Document Reviewed: 11/11/2012 [facility] Patient Information 2015 [place], [place]. This information is not intended to replace advice given to you by your health care provider. Make sure you discuss any questions you have with your health care provider. Asthma Asthma is a recurring condition in which the airways tighten and narrow. Asthma can make it difficult to breathe. It can cause coughing, wheezing,  and shortness of breath. Asthma episodes, also called asthma attacks, range from minor to life-threatening. Asthma cannot be cured, but medicines and lifestyle changes can help control it. CAUSES Asthma is believed to be caused by inherited (genetic) and environmental factors, but its exact cause is unknown. Asthma may be triggered by allergens, lung infections, or irritants in the air. Asthma triggers are different for each person. Common triggers include:   Animal dander.  Dust mites.  Cockroaches.  Pollen from trees or grass.  Mold.  Smoke.  Air pollutants such as dust, household cleaners, hair sprays, aerosol sprays, paint fumes, strong chemicals, or strong odors.  Cold air, weather changes, and winds (which increase molds and pollens in the air).  Strong emotional expressions such as crying or laughing hard.  Stress.  Certain medicines (such as aspirin) or types of drugs (such as beta-blockers).  Sulfites in foods and drinks. Foods and drinks that may contain sulfites include dried fruit, potato chips, and sparkling grape juice.  Infections or inflammatory conditions such as the flu, a cold, or an inflammation of the nasal membranes (rhinitis).  Gastroesophageal reflux disease (GERD).  Exercise or strenuous activity. SYMPTOMS Symptoms may occur immediately after asthma is triggered or many hours later.  Symptoms include:  Wheezing.  Excessive nighttime or early morning coughing.  Frequent or severe coughing with a common cold.  Chest tightness.  Shortness of breath. DIAGNOSIS  The diagnosis of asthma is made by a review of your medical history and a physical exam. Tests may also be performed. These may include:  Lung function studies. These tests show how much air you breathe in and out.  Allergy tests.  Imaging tests such as X-rays. TREATMENT  Asthma cannot be cured, but it can usually be controlled. Treatment involves identifying and avoiding your asthma triggers. It also involves medicines. There are 2 classes of medicine used for asthma treatment:   Controller medicines. These prevent asthma symptoms from occurring. They are usually taken every day.  Reliever or rescue medicines. These quickly relieve asthma symptoms. They are used as needed and provide short-term relief. Your health care provider will help you create an asthma action plan. An asthma action plan is a written plan for managing and treating your asthma attacks. It includes a list of your asthma triggers and how they may be avoided. It also includes information on when medicines should be taken and when their dosage should be changed. An action plan may also involve the use of a device called a peak flow meter. A peak flow meter measures how well the lungs are working. It helps you monitor your condition. HOME CARE INSTRUCTIONS   Take medicines only as directed by your health care provider. Speak with your health care provider if you have questions about how or when to take the medicines.  Use a peak flow meter as directed by your health care provider. Record and keep track of readings.  Understand and use the action plan to help minimize or stop an asthma attack without needing to seek medical care.  Control your home environment in the following ways to help prevent asthma attacks:  Do not smoke. Avoid being  exposed to secondhand smoke.  Change your heating and air conditioning filter regularly.  Limit your use of fireplaces and wood stoves.  Get rid of pests (such as roaches and mice) and their droppings.  Throw away plants if you see mold on them.  Clean your floors and dust regularly. Use unscented cleaning  products.  Try to have someone else vacuum for you regularly. Stay out of rooms while they are being vacuumed and for a short while afterward. If you vacuum, use a dust mask from a hardware store, a double-layered or microfilter vacuum cleaner bag, or a vacuum cleaner with a HEPA filter.  Replace carpet with wood, tile, or vinyl flooring. Carpet can trap dander and dust.  Use allergy-proof pillows, mattress covers, and box spring covers.  Wash bed sheets and blankets every week in hot water and dry them in a dryer.  Use blankets that are made of polyester or cotton.  Clean bathrooms and kitchens with bleach. If possible, have someone repaint the walls in these rooms with mold-resistant paint. Keep out of the rooms that are being cleaned and painted.  Wash hands frequently. SEEK MEDICAL CARE IF:   You have wheezing, shortness of breath, or a cough even if taking medicine to prevent attacks.  The colored mucus you cough up (sputum) is thicker than usual.  Your sputum changes from clear or white to yellow, green, gray, or bloody.  You have any problems that may be related to the medicines you are taking (such as a rash, itching, swelling, or trouble breathing).  You are using a reliever medicine more than 2-3 times per week.  Your peak flow is still at 50-79% of your personal best after following your action plan for 1 hour.  You have a fever. SEEK IMMEDIATE MEDICAL CARE IF:   You seem to be getting worse and are unresponsive to treatment during an asthma attack.  You are short of breath even at rest.  You get short of breath when doing very little physical  activity.  You have difficulty eating, drinking, or talking due to asthma symptoms.  You develop chest pain.  You develop a fast heartbeat.  You have a bluish color to your lips or fingernails.  You are light-headed, dizzy, or faint.  Your peak flow is less than 50% of your personal best. MAKE SURE YOU:   Understand these instructions.  Will watch your condition.  Will get help right away if you are not doing well or get worse. Document Released: 05/06/2005 Document Revised: 09/20/2013 Document Reviewed: 12/03/2012 [facility] Patient Information 2015 [place], [place]. This information is not intended to replace advice given to you by your health care provider. Make sure you discuss any questions you have with your health care provider.

## 2014-03-16 DIAGNOSIS — R7309 Other abnormal glucose: Secondary | ICD-10-CM | POA: Insufficient documentation

## 2014-03-21 ENCOUNTER — Ambulatory Visit: Payer: BC Managed Care – PPO | Admitting: Medical

## 2014-11-15 ENCOUNTER — Ambulatory Visit (INDEPENDENT_AMBULATORY_CARE_PROVIDER_SITE_OTHER): Payer: BLUE CROSS/BLUE SHIELD

## 2014-11-15 ENCOUNTER — Ambulatory Visit (INDEPENDENT_AMBULATORY_CARE_PROVIDER_SITE_OTHER): Payer: BLUE CROSS/BLUE SHIELD | Admitting: Physician Assistant

## 2014-11-15 VITALS — BP 110/68 | HR 90 | Temp 98.1°F | Resp 18 | Ht 66.0 in | Wt 171.0 lb

## 2014-11-15 DIAGNOSIS — D509 Iron deficiency anemia, unspecified: Secondary | ICD-10-CM | POA: Diagnosis not present

## 2014-11-15 DIAGNOSIS — Z8709 Personal history of other diseases of the respiratory system: Secondary | ICD-10-CM

## 2014-11-15 DIAGNOSIS — R05 Cough: Secondary | ICD-10-CM

## 2014-11-15 DIAGNOSIS — J4531 Mild persistent asthma with (acute) exacerbation: Secondary | ICD-10-CM

## 2014-11-15 DIAGNOSIS — J302 Other seasonal allergic rhinitis: Secondary | ICD-10-CM | POA: Diagnosis not present

## 2014-11-15 DIAGNOSIS — R059 Cough, unspecified: Secondary | ICD-10-CM

## 2014-11-15 LAB — POCT CBC
Granulocyte percent: 53 %G (ref 37–80)
HCT, POC: 44.2 % (ref 37.7–47.9)
Hemoglobin: 13.4 g/dL (ref 12.2–16.2)
Lymph, poc: 1.8 (ref 0.6–3.4)
MCH, POC: 23.1 pg — AB (ref 27–31.2)
MCHC: 30.3 g/dL — AB (ref 31.8–35.4)
MCV: 76 fL — AB (ref 80–97)
MID (cbc): 0.5 (ref 0–0.9)
MPV: 6.6 fL (ref 0–99.8)
POC Granulocyte: 2.6 (ref 2–6.9)
POC LYMPH PERCENT: 37.4 %L (ref 10–50)
POC MID %: 9.6 %M (ref 0–12)
Platelet Count, POC: 285 10*3/uL (ref 142–424)
RBC: 5.82 M/uL — AB (ref 4.04–5.48)
RDW, POC: 15.1 %
WBC: 4.9 10*3/uL (ref 4.6–10.2)

## 2014-11-15 LAB — IRON AND TIBC
%SAT: 20 % (ref 20–55)
Iron: 58 ug/dL (ref 42–145)
TIBC: 290 ug/dL (ref 250–470)
UIBC: 232 ug/dL (ref 125–400)

## 2014-11-15 MED ORDER — IPRATROPIUM BROMIDE 0.02 % IN SOLN: 0.5000 mg | Freq: Once | RESPIRATORY_TRACT | Status: DC

## 2014-11-15 MED ORDER — IPRATROPIUM BROMIDE 0.02 % IN SOLN
0.5000 mg | Freq: Once | RESPIRATORY_TRACT | Status: AC
  Administered 2014-11-15: 0.5 mg via RESPIRATORY_TRACT

## 2014-11-15 MED ORDER — PREDNISONE 20 MG PO TABS: 40.0000 mg | ORAL_TABLET | Freq: Every day | ORAL | Status: DC

## 2014-11-15 MED ORDER — ALBUTEROL SULFATE (2.5 MG/3ML) 0.083% IN NEBU
2.5000 mg | INHALATION_SOLUTION | Freq: Once | RESPIRATORY_TRACT | Status: AC
  Administered 2014-11-15: 2.5 mg via RESPIRATORY_TRACT

## 2014-11-15 MED ORDER — FLUTICASONE PROPIONATE 50 MCG/ACT NA SUSP: 2.0000 | Freq: Every day | NASAL | Status: DC

## 2014-11-15 MED ORDER — ALBUTEROL SULFATE (2.5 MG/3ML) 0.083% IN NEBU: 2.5000 mg | INHALATION_SOLUTION | Freq: Once | RESPIRATORY_TRACT | Status: DC

## 2014-11-15 MED ORDER — BECLOMETHASONE DIPROPIONATE 40 MCG/ACT IN AERS: 2.0000 | INHALATION_SPRAY | Freq: Two times a day (BID) | RESPIRATORY_TRACT | Status: AC

## 2014-11-15 MED ORDER — HYDROCODONE-HOMATROPINE 5-1.5 MG/5ML PO SYRP: 5.0000 mL | ORAL_SOLUTION | Freq: Three times a day (TID) | ORAL | Status: DC | PRN

## 2014-11-15 NOTE — Progress Notes (Signed)
11/15/2014 at 2:42 PM  Katherine Mckee / DOB: 28-Aug-1945 / MRN: [id number]  The patient  does not have a problem list on file.  SUBJECTIVE  Chief complaint: Cough  Patient here today for 10 days of coughing and reports that she does feel "tight" in her chest, which occurs with her asthma. She has a history of asthma and is maintained on Proair and nebulizers.  She has tried using her nebs with some mild relief. She denies fever, chills, diaphoresis.  She has had pneumonia three times in her lifetime.  She hs a history of GERD and is taking Prilosec 10 mg daily and denies GERD like symptoms.    She  has a past medical history of Asthma; Glaucoma; Anxiety; Allergy; GERD (gastroesophageal reflux disease); and Hypertension.    Medications reviewed and updated by myself where necessary, and exist elsewhere in the encounter.   [name] is allergic to sulfa antibiotics. She  reports that she has never smoked. She has never used smokeless tobacco. She reports that she drinks alcohol. She reports that she does not use illicit drugs. She  reports that she currently engages in sexual activity. She reports using the following method of birth control/protection: Condom. The patient  has past surgical history that includes Abdominal hysterectomy and Tubal ligation.  Her family history includes Cancer in her sister; Hypertension in her father, mother, and sister.  Review of Systems  Constitutional: Negative for chills.  Respiratory: Positive for cough and wheezing. Negative for hemoptysis, sputum production and shortness of breath.   Cardiovascular: Negative for chest pain and palpitations.  Gastrointestinal: Negative for nausea.  Skin: Negative for itching and rash.  Neurological: Negative for dizziness and headaches.    OBJECTIVE  Her  height is 5\' 6"  (1.676 m) and weight is 171 lb (77.565 kg). Her oral temperature is 98.1 F (36.7 C). Her blood pressure is 110/68 and her pulse is 90. Her respiration  is 18 and oxygen saturation is 98%.  The patient's body mass index is 27.61 kg/(m^2).  Physical Exam  Vitals reviewed. Constitutional: She is oriented to person, place, and time. She appears well-developed and well-nourished. No distress.  HENT:  Right Ear: Hearing, tympanic membrane, external ear and ear canal normal.  Left Ear: Hearing, tympanic membrane, external ear and ear canal normal.  Nose: Mucosal edema (bluish hue) present.  Mouth/Throat: Uvula is midline, oropharynx is clear and moist and mucous membranes are normal.  Eyes: Conjunctivae and EOM are normal. Pupils are equal, round, and reactive to light.  Neck: No thyromegaly present.  Cardiovascular: Normal rate and normal heart sounds.   Respiratory: Effort normal. She has no wheezes. She has no rales.  Exam difficult due to coughing.    GI: Soft.  Musculoskeletal: Normal range of motion.  Neurological: She is alert and oriented to person, place, and time. She has normal reflexes.  Skin: Skin is warm and dry. She is not diaphoretic. No pallor.  Psychiatric: She has a normal mood and affect.    Results for orders placed or performed in visit on 11/15/14 (from the past 24 hour(s))  POCT CBC     Status: Abnormal   Collection Time: 11/15/14  2:17 PM  Result Value Ref Range   WBC 4.9 4.6 - 10.2 K/uL   Lymph, poc 1.8 0.6 - 3.4   POC LYMPH PERCENT 37.4 10 - 50 %L   MID (cbc) 0.5 0 - 0.9   POC MID % 9.6 0 - 12 %M  POC Granulocyte 2.6 2 - 6.9   Granulocyte percent 53.0 37 - 80 %G   RBC 5.82 (A) 4.04 - 5.48 M/uL   Hemoglobin 13.4 12.2 - 16.2 g/dL   HCT, POC 44.2 37.7 - 47.9 %   MCV 76.0 (A) 80 - 97 fL   MCH, POC 23.1 (A) 27 - 31.2 pg   MCHC 30.3 (A) 31.8 - 35.4 g/dL   RDW, POC 15.1 %   Platelet Count, POC 285 142 - 424 K/uL   MPV 6.6 0 - 99.8 fL   [facility] reading (PRIMARY) by  Dr. Ouida Sills: Negative for cardiopulmonary disease.   ASSESSMENT & PLAN  Rosamond was seen today for cough.  Diagnoses and all orders for this  visit:  Cough: Work up reassuring from an infectious standpoint.  Will treat for history of asthma and step up therapy to an inhaled corticosteroid.   Orders: -     DG Chest 2 View; Future -     POCT CBC -     albuterol (PROVENTIL) (2.5 MG/3ML) 0.083% nebulizer solution 2.5 mg; Take 3 mLs (2.5 mg total) by nebulization once. -     ipratropium (ATROVENT) nebulizer solution 0.5 mg; Take 2.5 mLs (0.5 mg total) by nebulization once. -     HYDROcodone-homatropine (HYCODAN) 5-1.5 MG/5ML syrup; Take 5 mLs by mouth every 8 (eight) hours as needed for cough.  History of asthma Orders: -     albuterol (PROVENTIL) (2.5 MG/3ML) 0.083% nebulizer solution 2.5 mg; Take 3 mLs (2.5 mg total) by nebulization once. -     ipratropium (ATROVENT) nebulizer solution 0.5 mg; Take 2.5 mLs (0.5 mg total) by nebulization once. -     albuterol (PROVENTIL) (2.5 MG/3ML) 0.083% nebulizer solution 2.5 mg; Take 3 mLs (2.5 mg total) by nebulization once. -     ipratropium (ATROVENT) nebulizer solution 0.5 mg; Take 2.5 mLs (0.5 mg total) by nebulization once. -     beclomethasone (QVAR) 40 MCG/ACT inhaler; Inhale 2 puffs into the lungs 2 (two) times daily.  Seasonal allergies -     fluticasone (FLONASE) 50 MCG/ACT nasal spray; Place 2 sprays into both nostrils daily.  Microcytic anemia Orders: -     Iron and TIBC -     Ferritin  Asthma with acute exacerbation, mild persistent Orders: -     predniSONE (DELTASONE) 20 MG tablet; Take 2 tablets (40 mg total) by mouth daily with breakfast.    The patient was advised to call or come back to clinic if she does not see an improvement in symptoms, or worsens with the above plan.   Philis Fendt, MHS, PA-C [facility] 11/15/2014 2:42 PM

## 2014-11-15 NOTE — Progress Notes (Signed)
  Medical screening examination/treatment/procedure(s) were performed by non-physician practitioner and as supervising physician I was immediately available for consultation/collaboration.     

## 2014-11-16 LAB — FERRITIN: Ferritin: 200 ng/mL (ref 10–291)

## 2014-11-17 ENCOUNTER — Encounter: Payer: Self-pay | Admitting: Family Medicine

## 2015-01-18 ENCOUNTER — Ambulatory Visit (INDEPENDENT_AMBULATORY_CARE_PROVIDER_SITE_OTHER): Payer: BLUE CROSS/BLUE SHIELD | Admitting: Physician Assistant

## 2015-01-18 ENCOUNTER — Observation Stay (HOSPITAL_COMMUNITY)
Admission: EM | Admit: 2015-01-18 | Discharge: 2015-01-19 | Disposition: A | Discharge status: Home / Self Care | Payer: BLUE CROSS/BLUE SHIELD | Attending: Emergency Medicine | Admitting: Emergency Medicine

## 2015-01-18 ENCOUNTER — Emergency Department (HOSPITAL_COMMUNITY): Payer: BLUE CROSS/BLUE SHIELD

## 2015-01-18 ENCOUNTER — Encounter (HOSPITAL_COMMUNITY): Payer: Self-pay | Admitting: *Deleted

## 2015-01-18 VITALS — BP 105/69 | HR 115 | Temp 98.2°F | Resp 18 | Ht 67.5 in | Wt 173.0 lb

## 2015-01-18 DIAGNOSIS — H409 Unspecified glaucoma: Secondary | ICD-10-CM | POA: Insufficient documentation

## 2015-01-18 DIAGNOSIS — J45909 Unspecified asthma, uncomplicated: Secondary | ICD-10-CM

## 2015-01-18 DIAGNOSIS — R079 Chest pain, unspecified: Secondary | ICD-10-CM

## 2015-01-18 DIAGNOSIS — Z23 Encounter for immunization: Secondary | ICD-10-CM | POA: Insufficient documentation

## 2015-01-18 DIAGNOSIS — R059 Cough, unspecified: Secondary | ICD-10-CM

## 2015-01-18 DIAGNOSIS — K219 Gastro-esophageal reflux disease without esophagitis: Secondary | ICD-10-CM | POA: Diagnosis not present

## 2015-01-18 DIAGNOSIS — R5383 Other fatigue: Secondary | ICD-10-CM

## 2015-01-18 DIAGNOSIS — F419 Anxiety disorder, unspecified: Secondary | ICD-10-CM | POA: Diagnosis not present

## 2015-01-18 DIAGNOSIS — E119 Type 2 diabetes mellitus without complications: Secondary | ICD-10-CM | POA: Diagnosis not present

## 2015-01-18 DIAGNOSIS — Z7982 Long term (current) use of aspirin: Secondary | ICD-10-CM | POA: Insufficient documentation

## 2015-01-18 DIAGNOSIS — R42 Dizziness and giddiness: Secondary | ICD-10-CM | POA: Diagnosis not present

## 2015-01-18 DIAGNOSIS — Z7951 Long term (current) use of inhaled steroids: Secondary | ICD-10-CM | POA: Diagnosis not present

## 2015-01-18 DIAGNOSIS — R05 Cough: Secondary | ICD-10-CM | POA: Insufficient documentation

## 2015-01-18 DIAGNOSIS — Z8709 Personal history of other diseases of the respiratory system: Secondary | ICD-10-CM

## 2015-01-18 DIAGNOSIS — Z79899 Other long term (current) drug therapy: Secondary | ICD-10-CM | POA: Insufficient documentation

## 2015-01-18 DIAGNOSIS — I10 Essential (primary) hypertension: Secondary | ICD-10-CM | POA: Diagnosis not present

## 2015-01-18 DIAGNOSIS — I471 Supraventricular tachycardia: Secondary | ICD-10-CM

## 2015-01-18 DIAGNOSIS — R000 Tachycardia, unspecified: Secondary | ICD-10-CM | POA: Insufficient documentation

## 2015-01-18 DIAGNOSIS — R0789 Other chest pain: Secondary | ICD-10-CM | POA: Diagnosis not present

## 2015-01-18 HISTORY — DX: Type 2 diabetes mellitus without complications: E11.9

## 2015-01-18 LAB — COMPREHENSIVE METABOLIC PANEL
ALT: 24 U/L (ref 14–54)
AST: 22 U/L (ref 15–41)
Albumin: 3.8 g/dL (ref 3.5–5.0)
Alkaline Phosphatase: 98 U/L (ref 38–126)
Anion gap: 9 (ref 5–15)
BUN: 20 mg/dL (ref 6–20)
CO2: 20 mmol/L — ABNORMAL LOW (ref 22–32)
Calcium: 9.2 mg/dL (ref 8.9–10.3)
Chloride: 114 mmol/L — ABNORMAL HIGH (ref 101–111)
Creatinine, Ser: 1 mg/dL (ref 0.44–1.00)
GFR calc Af Amer: >60 mL/min (ref >60)
GFR calc non Af Amer: 56 mL/min — ABNORMAL LOW (ref >60)
Glucose, Bld: 137 mg/dL — ABNORMAL HIGH (ref 65–99)
Potassium: 3.7 mmol/L (ref 3.5–5.1)
Sodium: 143 mmol/L (ref 135–145)
Total Bilirubin: 0.2 mg/dL — ABNORMAL LOW (ref 0.3–1.2)
Total Protein: 7.5 g/dL (ref 6.5–8.1)

## 2015-01-18 LAB — LIPASE, BLOOD: Lipase: 26 U/L (ref 22–51)

## 2015-01-18 LAB — I-STAT TROPONIN, ED
Troponin i, poc: 0.06 ng/mL (ref 0.00–0.08)
Troponin i, poc: 0.03 ng/mL (ref 0.00–0.08)

## 2015-01-18 LAB — CBC WITH DIFFERENTIAL/PLATELET
Basophils Absolute: 0 10*3/uL (ref 0.0–0.1)
Basophils Relative: 0 % (ref 0–1)
Eosinophils Absolute: 0.1 10*3/uL (ref 0.0–0.7)
Eosinophils Relative: 1 % (ref 0–5)
HCT: 45.8 % (ref 36.0–46.0)
Hemoglobin: 15.4 g/dL — ABNORMAL HIGH (ref 12.0–15.0)
Lymphocytes Relative: 17 % (ref 12–46)
Lymphs Abs: 1.9 10*3/uL (ref 0.7–4.0)
MCH: 25.8 pg — ABNORMAL LOW (ref 26.0–34.0)
MCHC: 33.6 g/dL (ref 30.0–36.0)
MCV: 76.6 fL — ABNORMAL LOW (ref 78.0–100.0)
Monocytes Absolute: 1.7 10*3/uL — ABNORMAL HIGH (ref 0.1–1.0)
Monocytes Relative: 15 % — ABNORMAL HIGH (ref 3–12)
Neutro Abs: 7.5 10*3/uL (ref 1.7–7.7)
Neutrophils Relative %: 67 % (ref 43–77)
Platelets: 264 10*3/uL (ref 150–400)
RBC: 5.98 MIL/uL — ABNORMAL HIGH (ref 3.87–5.11)
RDW: 14.7 % (ref 11.5–15.5)
WBC: 11.2 10*3/uL — ABNORMAL HIGH (ref 4.0–10.5)

## 2015-01-18 LAB — D-DIMER, QUANTITATIVE (NOT AT ARMC): D-Dimer, Quant: 0.71 ug/mL-FEU — ABNORMAL HIGH (ref 0.00–0.48)

## 2015-01-18 MED ORDER — DESOXIMETASONE 0.25 % EX OINT: 1.0000 "application " | TOPICAL_OINTMENT | Freq: Two times a day (BID) | CUTANEOUS | Status: DC

## 2015-01-18 MED ORDER — BECLOMETHASONE DIPROPIONATE 40 MCG/ACT IN AERS: 2.0000 | INHALATION_SPRAY | Freq: Two times a day (BID) | RESPIRATORY_TRACT | Status: DC

## 2015-01-18 MED ORDER — CALCIUM CARBONATE 1250 (500 CA) MG PO TABS
625.0000 mg | ORAL_TABLET | Freq: Two times a day (BID) | ORAL | Status: DC
  Administered 2015-01-19: 625 mg via ORAL
  Filled 2015-01-18 (×2): qty 0.5
  Filled 2015-01-18: qty 2

## 2015-01-18 MED ORDER — MONTELUKAST SODIUM 10 MG PO TABS
10.0000 mg | ORAL_TABLET | Freq: Every day | ORAL | Status: DC
  Administered 2015-01-19: 10 mg via ORAL
  Filled 2015-01-18: qty 1

## 2015-01-18 MED ORDER — ACETAMINOPHEN 325 MG PO TABS: 650.0000 mg | ORAL_TABLET | ORAL | Status: DC | PRN

## 2015-01-18 MED ORDER — IPRATROPIUM BROMIDE 0.02 % IN SOLN: 0.5000 mg | Freq: Once | RESPIRATORY_TRACT | Status: DC

## 2015-01-18 MED ORDER — TRAMADOL HCL 50 MG PO TABS: 50.0000 mg | ORAL_TABLET | Freq: Every day | ORAL | Status: DC | PRN

## 2015-01-18 MED ORDER — NITROGLYCERIN 0.4 MG SL SUBL
0.4000 mg | SUBLINGUAL_TABLET | SUBLINGUAL | Status: DC | PRN
  Administered 2015-01-18: 0.4 mg via SUBLINGUAL

## 2015-01-18 MED ORDER — ASPIRIN 81 MG PO CHEW
324.0000 mg | CHEWABLE_TABLET | Freq: Once | ORAL | Status: AC
  Administered 2015-01-18: 324 mg via ORAL

## 2015-01-18 MED ORDER — GI COCKTAIL ~~LOC~~: 30.0000 mL | Freq: Four times a day (QID) | ORAL | Status: DC | PRN

## 2015-01-18 MED ORDER — MORPHINE SULFATE (PF) 2 MG/ML IV SOLN: 2.0000 mg | INTRAVENOUS | Status: DC | PRN

## 2015-01-18 MED ORDER — ASPIRIN EC 81 MG PO TBEC
81.0000 mg | DELAYED_RELEASE_TABLET | Freq: Every day | ORAL | Status: DC
  Administered 2015-01-19: 81 mg via ORAL
  Filled 2015-01-18 (×2): qty 1

## 2015-01-18 MED ORDER — GUAIFENESIN-DM 100-10 MG/5ML PO SYRP
5.0000 mL | ORAL_SOLUTION | ORAL | Status: DC | PRN
  Administered 2015-01-19: 5 mL via ORAL
  Filled 2015-01-18: qty 5

## 2015-01-18 MED ORDER — LATANOPROST 0.005 % OP SOLN
1.0000 [drp] | Freq: Every day | OPHTHALMIC | Status: DC
  Administered 2015-01-19: 1 [drp] via OPHTHALMIC
  Filled 2015-01-18: qty 2.5

## 2015-01-18 MED ORDER — IOHEXOL 350 MG/ML SOLN
80.0000 mL | Freq: Once | INTRAVENOUS | Status: AC | PRN
  Administered 2015-01-18: 100 mL via INTRAVENOUS

## 2015-01-18 MED ORDER — ONDANSETRON HCL 4 MG/2ML IJ SOLN: 4.0000 mg | Freq: Four times a day (QID) | INTRAMUSCULAR | Status: DC | PRN

## 2015-01-18 MED ORDER — CELECOXIB 200 MG PO CAPS
200.0000 mg | ORAL_CAPSULE | Freq: Every day | ORAL | Status: DC | PRN
  Filled 2015-01-18: qty 1

## 2015-01-18 MED ORDER — ALPRAZOLAM 0.25 MG PO TABS
0.2500 mg | ORAL_TABLET | Freq: Every day | ORAL | Status: DC
  Administered 2015-01-19: 0.25 mg via ORAL
  Filled 2015-01-18: qty 1

## 2015-01-18 MED ORDER — FLUTICASONE PROPIONATE 50 MCG/ACT NA SUSP
2.0000 | Freq: Every day | NASAL | Status: DC
  Administered 2015-01-19: 2 via NASAL
  Filled 2015-01-18: qty 16

## 2015-01-18 MED ORDER — CYCLOSPORINE 0.05 % OP EMUL
1.0000 [drp] | Freq: Two times a day (BID) | OPHTHALMIC | Status: DC
  Administered 2015-01-19 (×2): 1 [drp] via OPHTHALMIC
  Filled 2015-01-18 (×3): qty 1

## 2015-01-18 MED ORDER — FISH OIL + D3 1000-1000 MG-UNIT PO CAPS: 1.0000 | ORAL_CAPSULE | Freq: Every day | ORAL | Status: DC

## 2015-01-18 MED ORDER — ALBUTEROL SULFATE (2.5 MG/3ML) 0.083% IN NEBU
2.5000 mg | INHALATION_SOLUTION | Freq: Once | RESPIRATORY_TRACT | Status: DC
Start: 2015-01-18 — End: 2015-01-19

## 2015-01-18 NOTE — ED Notes (Signed)
Patient transported to X-ray 

## 2015-01-18 NOTE — Progress Notes (Signed)
[facility] [address] [place] [address] [phone]  Date:  01/18/2015   Name:  Katherine Mckee   DOB:  Jun 25, 1945   MRN:  [id number]  PCP:  Crisoforo Oxford, PA-C    Chief Complaint: Chest Pain and Fatigue   History of Present Illness:  This is a 69 y.o. female with PMH asthma, HTN and anxiety who is presenting with chest tightness and fatigue since waking this morning. She has felt intermittently dizzy. No assoc SOB or wheezing. This does not feel like an asthma flare to her. She went to work and started sweating all over. Her boss told her to leave and be seen. She does not have a history of CAD. She takes diovan daily for HTN. She took all her meds this morning except her daily aspirin 81 mg because she ran out. She has never had a cardiac arrhythmia.   Review of Systems:  Review of Systems See HPI  There are no active problems to display for this patient.   Prior to Admission medications   Medication Sig Start Date End Date Taking? Authorizing Provider  albuterol (PROVENTIL) (2.5 MG/3ML) 0.083% nebulizer solution Take 3 mLs (2.5 mg total) by nebulization every 6 (six) hours as needed. 01/06/14  Yes Shawnee Knapp, MD  ALPRAZolam Duanne Moron) 0.5 MG tablet Take 0.5 mg by mouth 2 (two) times daily.   Yes Historical Provider, MD  aspirin 81 MG tablet Take 81 mg by mouth daily.   Yes Historical Provider, MD  beclomethasone (QVAR) 40 MCG/ACT inhaler Inhale 2 puffs into the lungs 2 (two) times daily. 11/15/14  Yes Tereasa Coop, PA-C  calcium carbonate 200 MG capsule Take 250 mg by mouth 2 (two) times daily with a meal.   Yes Historical Provider, MD  DORZOLAMIDE HCL OP Apply to eye.   Yes Historical Provider, MD  Fish Oil-Cholecalciferol (FISH OIL + D3) 1000-1000 MG-UNIT CAPS Take by mouth.   Yes Historical Provider, MD  fluticasone (FLONASE) 50 MCG/ACT nasal spray Place 2 sprays into both nostrils daily. 11/15/14  Yes Tereasa Coop, PA-C  latanoprost (XALATAN)  0.005 % ophthalmic solution 1 drop at bedtime.   Yes Historical Provider, MD  montelukast (SINGULAIR) 10 MG tablet Take 10 mg by mouth at bedtime.   Yes Historical Provider, MD  omeprazole (PRILOSEC) 10 MG capsule Take 10 mg by mouth daily.   Yes Historical Provider, MD  valsartan (DIOVAN) 320 MG tablet Take 320 mg by mouth daily.   Yes Historical Provider, MD  VITAMIN D, CHOLECALCIFEROL, PO Take by mouth.   Yes Historical Provider, MD           Allergies  Allergen Reactions  . Sulfa Antibiotics     Past Surgical History  Procedure Laterality Date  . Abdominal hysterectomy    . Tubal ligation      Social History  Substance Use Topics  . Smoking status: Never Smoker   . Smokeless tobacco: Never Used  . Alcohol Use: Yes    Family History  Problem Relation Age of Onset  . Hypertension Mother   . Hypertension Father   . Cancer Sister   . Hypertension Sister     Medication list has been reviewed and updated.  Physical Examination:  Physical Exam  Constitutional: She is oriented to person, place, and time. She appears well-developed and well-nourished. No distress.  HENT:  Head: Normocephalic and atraumatic.  Right Ear: Hearing normal.  Left Ear: Hearing normal.  Nose: Nose normal.  Eyes: Conjunctivae and lids are normal. Right eye exhibits no discharge. Left eye exhibits no discharge. No scleral icterus.  Neck: Trachea normal.  Cardiovascular: Regular rhythm, normal heart sounds and normal pulses.  Tachycardia present.   No murmur heard. Pulmonary/Chest: Effort normal and breath sounds normal. No respiratory distress. She has no wheezes. She has no rhonchi. She has no rales.  Abdominal: Soft. Normal appearance. There is no tenderness.  Musculoskeletal: Normal range of motion.  Neurological: She is alert and oriented to person, place, and time.  Skin: Skin is warm, dry and intact. No lesion and no rash noted.  No LE edema  Psychiatric: She has a normal mood and  affect. Her speech is normal and behavior is normal. Thought content normal.   BP 105/69 mmHg  Pulse 115  Temp(Src) 98.2 F (36.8 C) (Oral)  Resp 18  Ht 5' 7.5" (1.715 m)  Wt 173 lb (78.472 kg)  BMI 26.68 kg/m2  SpO2 97%  EKG interpreted with Dr. Lorelei Pont: sinus tachycardia vs atrial flutter  Assessment and Plan:  1. Chest pain, unspecified chest pain type 2. Other fatigue 3. Dizziness 4. Sinus tachycardia EKG initially worrisome for atrial flutter but more likely to be sinus tach. Pt is symptomatic with chest pain, fatigue, diaphoresis and dizziness. No assoc SOB, wheezing or palpitations. Given aspirin 324 mg chewable. IV placed. No nitro given since BP low at 105/64. Transferred to hospital by EMS. - EKG 12-Lead - aspirin chewable tablet 324 mg; Chew 4 tablets (324 mg total) by mouth once.    Benjaman Pott Drenda Freeze, MHS [facility]  01/18/2015

## 2015-01-18 NOTE — ED Notes (Signed)
Pt presents via [facility] from [facility] for c/o CP.  Pt woke up this AM with centralized chest tightness radiating to both shoulders and to her back, also c/o fatigue.  Pt denies N/V/SOB.  UC called EMS for possible A flutter, pt was ST with EMS.  Hx: ashtma, anxiety, htn.  Pt reports non -productive cough x 5 days.  UC administered 324 ASA, EMS gave 1 NTG decreasing pain from 3/10 to 2/10.   Lung sounds clear.  BP-125/72 O2-97%, Pt a x 4, NAD.

## 2015-01-18 NOTE — H&P (Signed)
PCP:   Crisoforo Oxford, PA-C   Chief Complaint:  Chest pain  HPI: 69 yo female with waxing and waning chest pain since 930am today.  Pt said the pain started in her right shoulder than wrapped around her front chest to her left shoulder.  Occurred for less than one minute.  But then throughout the day til 1pm she kept having several episodes that lasted less than a couple of minutes.  No fevers.   Some cough for 5 days.  No n/v/d.  She was given ntg which relieved her pain in the ED and has not had any since arrival to ED.  No prior cardiac work up.  No swelling in legs.  Referred for admission for cardiac work up.  She has had trop neg x 2 thus far.  Review of Systems:  Positive and negative as per HPI otherwise all other systems are negative  Past Medical History: Past Medical History  Diagnosis Date  . Asthma   . Glaucoma   . Anxiety   . Allergy   . GERD (gastroesophageal reflux disease)   . Hypertension   . Diabetes mellitus without complication    Past Surgical History  Procedure Laterality Date  . Abdominal hysterectomy    . Tubal ligation      Medications: Prior to Admission medications   Medication Sig Start Date End Date Taking? Authorizing Provider  albuterol (PROVENTIL) (2.5 MG/3ML) 0.083% nebulizer solution Take 3 mLs (2.5 mg total) by nebulization every 6 (six) hours as needed. Patient taking differently: Take 2.5 mg by nebulization every 6 (six) hours as needed for wheezing or shortness of breath.  01/06/14  Yes Shawnee Knapp, MD  ALPRAZolam Duanne Moron) 0.25 MG tablet Take 0.25 mg by mouth daily. 01/05/15  Yes Historical Provider, MD  aspirin 81 MG tablet Take 81 mg by mouth daily.   Yes Historical Provider, MD  beclomethasone (QVAR) 40 MCG/ACT inhaler Inhale 2 puffs into the lungs 2 (two) times daily. 11/15/14  Yes Tereasa Coop, PA-C  calcium carbonate 200 MG capsule Take 250 mg by mouth 2 (two) times daily with a meal.   Yes Historical Provider, MD  celecoxib  (CELEBREX) 200 MG capsule Take 200 mg by mouth daily as needed for mild pain or moderate pain.  12/19/14  Yes Historical Provider, MD  Desoximetasone (TOPICORT) 0.25 % ointment Apply 1 application topically 2 (two) times daily. 12/19/14  Yes Historical Provider, MD  DORZOLAMIDE HCL OP Apply 1 drop to eye 3 (three) times daily.    Yes Historical Provider, MD  Fish Oil-Cholecalciferol (FISH OIL + D3) 1000-1000 MG-UNIT CAPS Take 1 tablet by mouth daily.    Yes Historical Provider, MD  fluticasone (FLONASE) 50 MCG/ACT nasal spray Place 2 sprays into both nostrils daily. 11/15/14  Yes Tereasa Coop, PA-C  latanoprost (XALATAN) 0.005 % ophthalmic solution Place 1 drop into both eyes at bedtime.    Yes Historical Provider, MD  metFORMIN (GLUCOPHAGE-XR) 500 MG 24 hr tablet Take 500 mg by mouth daily after supper. 01/16/15  Yes Historical Provider, MD  montelukast (SINGULAIR) 10 MG tablet Take 10 mg by mouth at bedtime.   Yes Historical Provider, MD  omeprazole (PRILOSEC) 10 MG capsule Take 10 mg by mouth daily.   Yes Historical Provider, MD  RESTASIS 0.05 % ophthalmic emulsion Place 1 drop into both eyes 2 (two) times daily. 01/16/15  Yes Historical Provider, MD  traMADol (ULTRAM) 50 MG tablet Take 1 tablet by mouth daily as  needed for moderate pain or severe pain.  12/27/14  Yes Historical Provider, MD  valsartan (DIOVAN) 320 MG tablet Take 320 mg by mouth daily.   Yes Historical Provider, MD  VITAMIN D, CHOLECALCIFEROL, PO Take 1 tablet by mouth daily.    Yes Historical Provider, MD  albuterol (PROVENTIL HFA;VENTOLIN HFA) 108 (90 BASE) MCG/ACT inhaler Inhale 2 puffs into the lungs every 4 (four) hours as needed for wheezing (cough, shortness of breath or wheezing.). Patient not taking: Reported on 01/18/2015 01/06/14   Shawnee Knapp, MD    Allergies:   Allergies  Allergen Reactions  . Sulfa Antibiotics     Social History:  reports that she has never smoked. She has never used smokeless tobacco. She reports  that she drinks alcohol. She reports that she does not use illicit drugs.  Family History: Family History  Problem Relation Age of Onset  . Hypertension Mother   . Hypertension Father   . Cancer Sister   . Hypertension Sister     Physical Exam: Filed Vitals:   01/18/15 2145 01/18/15 2200 01/18/15 2230 01/18/15 2300  BP: 165/80 139/65 127/64 123/64  Pulse: 93 80 85 86  Temp:      TempSrc:      Resp: 19 12 17 17   SpO2: 100% 99% 97% 97%   General appearance: alert, cooperative and no distress Head: Normocephalic, without obvious abnormality, atraumatic Eyes: negative Nose: Nares normal. Septum midline. Mucosa normal. No drainage or sinus tenderness. Neck: no JVD and supple, symmetrical, trachea midline Lungs: clear to auscultation bilaterally Heart: regular rate and rhythm, S1, S2 normal, no murmur, click, rub or gallop Abdomen: soft, non-tender; bowel sounds normal; no masses,  no organomegaly Extremities: extremities normal, atraumatic, no cyanosis or edema Pulses: 2+ and symmetric Skin: Skin color, texture, turgor normal. No rashes or lesions Neurologic: Grossly normal    Labs on Admission:   Recent Labs  01/18/15 1711  NA 143  K 3.7  CL 114*  CO2 20*  GLUCOSE 137*  BUN 20  CREATININE 1.00  CALCIUM 9.2    Recent Labs  01/18/15 1711  AST 22  ALT 24  ALKPHOS 98  BILITOT 0.2*  PROT 7.5  ALBUMIN 3.8    Recent Labs  01/18/15 1711  LIPASE 26    Recent Labs  01/18/15 1711  WBC 11.2*  NEUTROABS 7.5  HGB 15.4*  HCT 45.8  MCV 76.6*  PLT 264   Radiological Exams on Admission: Dg Chest 2 View  01/18/2015   CLINICAL DATA:  Centralized chest pain  EXAM: CHEST  2 VIEW  COMPARISON:  01/29/2010  FINDINGS: The heart size and mediastinal contours are within normal limits. Both lungs are clear. The visualized skeletal structures are unremarkable.  IMPRESSION: No active cardiopulmonary disease.   Electronically Signed   By: Conchita Paris M.D.   On:  01/18/2015 17:57   Ct Angio Chest Pe W/cm &/or Wo Cm  01/18/2015   CLINICAL DATA:  Tachycardia, chest pain, shortness breath  EXAM: CT ANGIOGRAPHY CHEST WITH CONTRAST  TECHNIQUE: Multidetector CT imaging of the chest was performed using the standard protocol during bolus administration of intravenous contrast. Multiplanar CT image reconstructions and MIPs were obtained to evaluate the vascular anatomy.  CONTRAST:  152mL OMNIPAQUE IOHEXOL 350 MG/ML SOLN  COMPARISON:  None.  FINDINGS: There is adequate opacification of the pulmonary arteries. There is no pulmonary embolus. The main pulmonary artery, right main pulmonary artery and left main pulmonary arteries are normal in size.  The heart size is normal. There is no pericardial effusion.  The lungs are clear. There is no focal consolidation, pleural effusion or pneumothorax.  There is no axillary, hilar, or mediastinal adenopathy.  There is no lytic or blastic osseous lesion.  The visualized portions of the upper abdomen are unremarkable.  Review of the MIP images confirms the above findings.  IMPRESSION: 1. No evidence of pulmonary embolus.   Electronically Signed   By: Kathreen Devoid   On: 01/18/2015 20:17   ekg reviewed nsr no acute issues cxr reviewed no edema or infiltrate  Assessment/Plan  69 yo female with atypical chest pain  Principal Problem:   Chest pain-  Serial trop.  Echo in am.  Outpatient stress testing.  Aspirin daily.  Pain free at this time.  Active Problems:   Essential hypertension- stable   Anxiety- noted   Asthma, chronic- lungs clear  obs on tele.  Full code.  Matthias Bogus A 01/18/2015, 11:18 PM

## 2015-01-18 NOTE — ED Notes (Signed)
David, MD at bedside.  

## 2015-01-18 NOTE — ED Notes (Signed)
Family at bedside. 

## 2015-01-18 NOTE — ED Notes (Signed)
PA at bedside.

## 2015-01-18 NOTE — ED Provider Notes (Signed)
2150 - Patient care assumed from [facility], PA-C at shift change. Patient presenting for a central chest tightness which started suddenly this afternoon with associated diaphoresis and generalized weakness. Pain radiated across collarbone b/l and through to back. Patient without chest pain or tightness at this time. History significant for HTN only. Heart score 4 based on age, risk factors, and suspicion. Initial cardiac work up reassuring.  Plan includes admission for chest pain rule out. Patient does not feel comfortable following up with her PCP, Dr. Willey Blade, as an outpatient. She states that her symptoms were alarming to her. I believe this is reasonable given her moderate risk given age, symptoms presentation and risk factors. Will consult with [facility] for admission.  2235 - Case discussed with Dr. Shanon Brow; [facility] to admit.   Results for orders placed or performed during the hospital encounter of 01/18/15  Comprehensive metabolic panel  Result Value Ref Range   Sodium 143 135 - 145 mmol/L   Potassium 3.7 3.5 - 5.1 mmol/L   Chloride 114 (H) 101 - 111 mmol/L   CO2 20 (L) 22 - 32 mmol/L   Glucose, Bld 137 (H) 65 - 99 mg/dL   BUN 20 6 - 20 mg/dL   Creatinine, Ser 1.00 0.44 - 1.00 mg/dL   Calcium 9.2 8.9 - 10.3 mg/dL   Total Protein 7.5 6.5 - 8.1 g/dL   Albumin 3.8 3.5 - 5.0 g/dL   AST 22 15 - 41 U/L   ALT 24 14 - 54 U/L   Alkaline Phosphatase 98 38 - 126 U/L   Total Bilirubin 0.2 (L) 0.3 - 1.2 mg/dL   GFR calc non Af Amer 56 (L) >60 mL/min   GFR calc Af Amer >60 >60 mL/min   Anion gap 9 5 - 15  Lipase, blood  Result Value Ref Range   Lipase 26 22 - 51 U/L  CBC with Differential  Result Value Ref Range   WBC 11.2 (H) 4.0 - 10.5 K/uL   RBC 5.98 (H) 3.87 - 5.11 MIL/uL   Hemoglobin 15.4 (H) 12.0 - 15.0 g/dL   HCT 45.8 36.0 - 46.0 %   MCV 76.6 (L) 78.0 - 100.0 fL   MCH 25.8 (L) 26.0 - 34.0 pg   MCHC 33.6 30.0 - 36.0 g/dL   RDW 14.7 11.5 - 15.5 %   Platelets 264 150 -  400 K/uL   Neutrophils Relative % 67 43 - 77 %   Neutro Abs 7.5 1.7 - 7.7 K/uL   Lymphocytes Relative 17 12 - 46 %   Lymphs Abs 1.9 0.7 - 4.0 K/uL   Monocytes Relative 15 (H) 3 - 12 %   Monocytes Absolute 1.7 (H) 0.1 - 1.0 K/uL   Eosinophils Relative 1 0 - 5 %   Eosinophils Absolute 0.1 0.0 - 0.7 K/uL   Basophils Relative 0 0 - 1 %   Basophils Absolute 0.0 0.0 - 0.1 K/uL  D-dimer, quantitative (not at [facility])  Result Value Ref Range   D-Dimer, Quant 0.71 (H) 0.00 - 0.48 ug/mL-FEU  I-stat troponin, ED  Result Value Ref Range   Troponin i, poc 0.06 0.00 - 0.08 ng/mL   Comment 3          I-stat troponin, ED  Result Value Ref Range   Troponin i, poc 0.03 0.00 - 0.08 ng/mL   Comment 3           Dg Chest 2 View  01/18/2015   CLINICAL DATA:  Centralized  chest pain  EXAM: CHEST  2 VIEW  COMPARISON:  01/29/2010  FINDINGS: The heart size and mediastinal contours are within normal limits. Both lungs are clear. The visualized skeletal structures are unremarkable.  IMPRESSION: No active cardiopulmonary disease.   Electronically Signed   By: Conchita Paris M.D.   On: 01/18/2015 17:57   Ct Angio Chest Pe W/cm &/or Wo Cm  01/18/2015   CLINICAL DATA:  Tachycardia, chest pain, shortness breath  EXAM: CT ANGIOGRAPHY CHEST WITH CONTRAST  TECHNIQUE: Multidetector CT imaging of the chest was performed using the standard protocol during bolus administration of intravenous contrast. Multiplanar CT image reconstructions and MIPs were obtained to evaluate the vascular anatomy.  CONTRAST:  114mL OMNIPAQUE IOHEXOL 350 MG/ML SOLN  COMPARISON:  None.  FINDINGS: There is adequate opacification of the pulmonary arteries. There is no pulmonary embolus. The main pulmonary artery, right main pulmonary artery and left main pulmonary arteries are normal in size. The heart size is normal. There is no pericardial effusion.  The lungs are clear. There is no focal consolidation, pleural effusion or pneumothorax.  There is no  axillary, hilar, or mediastinal adenopathy.  There is no lytic or blastic osseous lesion.  The visualized portions of the upper abdomen are unremarkable.  Review of the MIP images confirms the above findings.  IMPRESSION: 1. No evidence of pulmonary embolus.   Electronically Signed   By: Kathreen Devoid   On: 01/18/2015 20:17      Antonietta Breach, PA-C 01/18/15 2238

## 2015-01-18 NOTE — ED Provider Notes (Signed)
CSN: [id number]     Arrival date & time 01/18/15  1554 History   First MD Initiated Contact with Patient 01/18/15 1600     Chief Complaint  Patient presents with  . Chest Pain     (Consider location/radiation/quality/duration/timing/severity/associated sxs/prior Treatment) HPI Comments: Pt presents via [facility] from [facility] for c/o CP. Pt woke up this AM with centralized chest tightness radiating to both shoulders and to her back, also c/o fatigue. Pt denies N/V/SOB. UC called EMS for possible A flutter, pt was ST with EMS. Hx: ashtma, anxiety, htn. Pt reports non -productive cough x 5 days. UC administered 324 ASA, EMS gave 1 NTG decreasing pain from 3/10 to 2/10.   Patient is a 69 y.o. female presenting with chest pain. The history is provided by the patient.  Chest Pain Pain location:  L chest and R chest Pain quality: pressure and tightness   Pain radiates to:  L shoulder and R shoulder Pain radiates to the back: no   Pain severity:  Mild Onset quality:  Gradual Duration:  7 hours Timing:  Constant Progression:  Unchanged Chronicity:  New Context: movement   Relieved by:  Nitroglycerin and aspirin Worsened by:  Movement Ineffective treatments:  None tried Associated symptoms: cough   Associated symptoms: no abdominal pain, no fever and no lower extremity edema   Risk factors: diabetes mellitus and hypertension   Risk factors: no prior DVT/PE     Past Medical History  Diagnosis Date  . Asthma   . Glaucoma   . Anxiety   . Allergy   . GERD (gastroesophageal reflux disease)   . Hypertension   . Diabetes mellitus without complication    Past Surgical History  Procedure Laterality Date  . Abdominal hysterectomy    . Tubal ligation     Family History  Problem Relation Age of Onset  . Hypertension Mother   . Hypertension Father   . Cancer Sister   . Hypertension Sister    Social History  Substance Use Topics  . Smoking status: Never Smoker   . Smokeless  tobacco: Never Used  . Alcohol Use: Yes   OB History    No data available     Review of Systems  Constitutional: Negative for fever.  Respiratory: Positive for cough.   Cardiovascular: Positive for chest pain.  Gastrointestinal: Negative for abdominal pain.  All other systems reviewed and are negative.     Allergies  Sulfa antibiotics  Home Medications   Prior to Admission medications   Medication Sig Start Date End Date Taking? Authorizing Provider  albuterol (PROVENTIL) (2.5 MG/3ML) 0.083% nebulizer solution Take 3 mLs (2.5 mg total) by nebulization every 6 (six) hours as needed. Patient taking differently: Take 2.5 mg by nebulization every 6 (six) hours as needed for wheezing or shortness of breath.  01/06/14  Yes Shawnee Knapp, MD  ALPRAZolam Duanne Moron) 0.25 MG tablet Take 0.25 mg by mouth daily. 01/05/15  Yes Historical Provider, MD  aspirin 81 MG tablet Take 81 mg by mouth daily.   Yes Historical Provider, MD  beclomethasone (QVAR) 40 MCG/ACT inhaler Inhale 2 puffs into the lungs 2 (two) times daily. 11/15/14  Yes Tereasa Coop, PA-C  calcium carbonate 200 MG capsule Take 250 mg by mouth 2 (two) times daily with a meal.   Yes Historical Provider, MD  celecoxib (CELEBREX) 200 MG capsule Take 200 mg by mouth daily as needed for mild pain or moderate pain.  12/19/14  Yes Historical  Provider, MD  Desoximetasone (TOPICORT) 0.25 % ointment Apply 1 application topically 2 (two) times daily. 12/19/14  Yes Historical Provider, MD  DORZOLAMIDE HCL OP Apply 1 drop to eye 3 (three) times daily.    Yes Historical Provider, MD  Fish Oil-Cholecalciferol (FISH OIL + D3) 1000-1000 MG-UNIT CAPS Take 1 tablet by mouth daily.    Yes Historical Provider, MD  fluticasone (FLONASE) 50 MCG/ACT nasal spray Place 2 sprays into both nostrils daily. 11/15/14  Yes Tereasa Coop, PA-C  latanoprost (XALATAN) 0.005 % ophthalmic solution Place 1 drop into both eyes at bedtime.    Yes Historical Provider, MD   metFORMIN (GLUCOPHAGE-XR) 500 MG 24 hr tablet Take 500 mg by mouth daily after supper. 01/16/15  Yes Historical Provider, MD  montelukast (SINGULAIR) 10 MG tablet Take 10 mg by mouth at bedtime.   Yes Historical Provider, MD  omeprazole (PRILOSEC) 10 MG capsule Take 10 mg by mouth daily.   Yes Historical Provider, MD  RESTASIS 0.05 % ophthalmic emulsion Place 1 drop into both eyes 2 (two) times daily. 01/16/15  Yes Historical Provider, MD  traMADol (ULTRAM) 50 MG tablet Take 1 tablet by mouth daily as needed for moderate pain or severe pain.  12/27/14  Yes Historical Provider, MD  valsartan (DIOVAN) 320 MG tablet Take 320 mg by mouth daily.   Yes Historical Provider, MD  VITAMIN D, CHOLECALCIFEROL, PO Take 1 tablet by mouth daily.    Yes Historical Provider, MD  albuterol (PROVENTIL HFA;VENTOLIN HFA) 108 (90 BASE) MCG/ACT inhaler Inhale 2 puffs into the lungs every 4 (four) hours as needed for wheezing (cough, shortness of breath or wheezing.). Patient not taking: Reported on 01/18/2015 01/06/14   Shawnee Knapp, MD   BP 135/66 mmHg  Pulse 90  Temp(Src) 98.3 F (36.8 C) (Oral)  Resp 17  SpO2 99% Physical Exam  Constitutional: She is oriented to person, place, and time. She appears well-developed and well-nourished.  HENT:  Head: Normocephalic and atraumatic.  Right Ear: External ear normal.  Left Ear: External ear normal.  Nose: Nose normal.  Eyes: Conjunctivae are normal.  Neck: Neck supple.  Cardiovascular: Regular rhythm and normal heart sounds.  Tachycardia present.   Pulmonary/Chest: Effort normal and breath sounds normal. No respiratory distress.  Abdominal: Soft. There is no tenderness.  Musculoskeletal: Normal range of motion. She exhibits no edema.  Neurological: She is alert and oriented to person, place, and time.  Skin: Skin is warm and dry.  Nursing note and vitals reviewed.   ED Course  Procedures (including critical care time) Medications  nitroGLYCERIN (NITROSTAT) SL  tablet 0.4 mg (0.4 mg Sublingual Given 01/18/15 1617)  iohexol (OMNIPAQUE) 350 MG/ML injection 80 mL (100 mLs Intravenous Contrast Given 01/18/15 1950)    Labs Review Labs Reviewed  COMPREHENSIVE METABOLIC PANEL - Abnormal; Notable for the following:    Chloride 114 (*)    CO2 20 (*)    Glucose, Bld 137 (*)    Total Bilirubin 0.2 (*)    GFR calc non Af Amer 56 (*)    All other components within normal limits  CBC WITH DIFFERENTIAL/PLATELET - Abnormal; Notable for the following:    WBC 11.2 (*)    RBC 5.98 (*)    Hemoglobin 15.4 (*)    MCV 76.6 (*)    MCH 25.8 (*)    Monocytes Relative 15 (*)    Monocytes Absolute 1.7 (*)    All other components within normal limits  D-DIMER, QUANTITATIVE (NOT  AT [facility]) - Abnormal; Notable for the following:    D-Dimer, Quant 0.71 (*)    All other components within normal limits  LIPASE, BLOOD  I-STAT TROPOININ, ED  Randolm Idol, ED    Imaging Review Dg Chest 2 View  01/18/2015   CLINICAL DATA:  Centralized chest pain  EXAM: CHEST  2 VIEW  COMPARISON:  01/29/2010  FINDINGS: The heart size and mediastinal contours are within normal limits. Both lungs are clear. The visualized skeletal structures are unremarkable.  IMPRESSION: No active cardiopulmonary disease.   Electronically Signed   By: Conchita Paris M.D.   On: 01/18/2015 17:57   I have personally reviewed and evaluated these images and lab results as part of my medical decision-making.   EKG Interpretation   Date/Time:  Wednesday January 18 2015 16:01:33 EDT Ventricular Rate:  109 PR Interval:  165 QRS Duration: 80 QT Interval:  309 QTC Calculation: 416 R Axis:   31 Text Interpretation:  Sinus tachycardia Consider right atrial enlargement  Abnormal R-wave progression, early transition No acute changes Confirmed  by Kathrynn Humble, MD, ANKIT [phone]) on 01/18/2015 5:30:39 PM      MDM   Final diagnoses:  Acute chest pain    Filed Vitals:   01/18/15 1930  BP: 135/66  Pulse: 90   Temp:   Resp: 17   Patient signed out to [employer], PA-C pending Ct angio chest. Will obtain delta troponin. Dispo pending re-evaluation and test results.     Baron Sane, PA-C 01/18/15 2009  Varney Biles, MD 01/20/15 1728

## 2015-01-18 NOTE — ED Notes (Signed)
Patient transported to CT 

## 2015-01-19 ENCOUNTER — Telehealth: Payer: Self-pay | Admitting: Cardiology

## 2015-01-19 ENCOUNTER — Ambulatory Visit (HOSPITAL_BASED_OUTPATIENT_CLINIC_OR_DEPARTMENT_OTHER): Payer: BLUE CROSS/BLUE SHIELD

## 2015-01-19 ENCOUNTER — Telehealth: Payer: Self-pay | Admitting: Medical

## 2015-01-19 DIAGNOSIS — E119 Type 2 diabetes mellitus without complications: Secondary | ICD-10-CM | POA: Diagnosis not present

## 2015-01-19 DIAGNOSIS — F419 Anxiety disorder, unspecified: Secondary | ICD-10-CM | POA: Diagnosis not present

## 2015-01-19 DIAGNOSIS — I10 Essential (primary) hypertension: Secondary | ICD-10-CM | POA: Diagnosis not present

## 2015-01-19 DIAGNOSIS — R079 Chest pain, unspecified: Secondary | ICD-10-CM

## 2015-01-19 DIAGNOSIS — R0789 Other chest pain: Secondary | ICD-10-CM | POA: Diagnosis not present

## 2015-01-19 LAB — URINALYSIS, ROUTINE W REFLEX MICROSCOPIC
Bilirubin Urine: NEGATIVE
Glucose, UA: NEGATIVE mg/dL
Hgb urine dipstick: NEGATIVE
Ketones, ur: NEGATIVE mg/dL
Nitrite: NEGATIVE
Protein, ur: NEGATIVE mg/dL
Specific Gravity, Urine: 1.024 (ref 1.005–1.030)
Urobilinogen, UA: 0.2 mg/dL (ref 0.0–1.0)
pH: 5.5 (ref 5.0–8.0)

## 2015-01-19 LAB — TROPONIN I
Troponin I: 0.03 ng/mL (ref <0.031)
Troponin I: <0.03 ng/mL (ref <0.031)
Troponin I: <0.03 ng/mL (ref <0.031)

## 2015-01-19 LAB — URINE MICROSCOPIC-ADD ON

## 2015-01-19 LAB — GLUCOSE, CAPILLARY
Glucose-Capillary: 117 mg/dL — ABNORMAL HIGH (ref 65–99)
Glucose-Capillary: 123 mg/dL — ABNORMAL HIGH (ref 65–99)

## 2015-01-19 MED ORDER — OMEGA-3-ACID ETHYL ESTERS 1 G PO CAPS
1.0000 g | ORAL_CAPSULE | Freq: Every day | ORAL | Status: DC
  Administered 2015-01-19: 1 g via ORAL
  Filled 2015-01-19: qty 1

## 2015-01-19 MED ORDER — INFLUENZA VAC SPLIT QUAD 0.5 ML IM SUSY: 0.5000 mL | PREFILLED_SYRINGE | INTRAMUSCULAR | Status: DC

## 2015-01-19 MED ORDER — MOMETASONE FUROATE 0.1 % EX CREA
TOPICAL_CREAM | Freq: Two times a day (BID) | CUTANEOUS | Status: DC
  Filled 2015-01-19: qty 15

## 2015-01-19 MED ORDER — INSULIN ASPART 100 UNIT/ML ~~LOC~~ SOLN: 0.0000 [IU] | Freq: Three times a day (TID) | SUBCUTANEOUS | Status: DC

## 2015-01-19 MED ORDER — BUDESONIDE 0.25 MG/2ML IN SUSP
0.2500 mg | Freq: Two times a day (BID) | RESPIRATORY_TRACT | Status: DC
  Administered 2015-01-19: 0.25 mg via RESPIRATORY_TRACT
  Filled 2015-01-19: qty 2

## 2015-01-19 MED ORDER — VITAMIN D 1000 UNITS PO TABS
1000.0000 [IU] | ORAL_TABLET | Freq: Every day | ORAL | Status: DC
  Administered 2015-01-19: 1000 [IU] via ORAL
  Filled 2015-01-19: qty 1

## 2015-01-19 MED ORDER — IPRATROPIUM-ALBUTEROL 0.5-2.5 (3) MG/3ML IN SOLN: 3.0000 mL | Freq: Four times a day (QID) | RESPIRATORY_TRACT | Status: DC | PRN

## 2015-01-19 MED ORDER — INFLUENZA VAC SPLIT QUAD 0.5 ML IM SUSY
0.5000 mL | PREFILLED_SYRINGE | INTRAMUSCULAR | Status: AC
Start: 2015-01-19 — End: 2015-01-19
  Administered 2015-01-19: 0.5 mL via INTRAMUSCULAR
  Filled 2015-01-19: qty 0.5

## 2015-01-19 MED ORDER — PNEUMOCOCCAL VAC POLYVALENT 25 MCG/0.5ML IJ INJ
0.5000 mL | INJECTION | INTRAMUSCULAR | Status: AC
  Administered 2015-01-19: 0.5 mL via INTRAMUSCULAR
  Filled 2015-01-19: qty 0.5

## 2015-01-19 MED ORDER — DEXTROSE 5 % IV SOLN
1.0000 g | INTRAVENOUS | Status: DC
  Administered 2015-01-19: 1 g via INTRAVENOUS
  Filled 2015-01-19: qty 10

## 2015-01-19 MED ORDER — PNEUMOCOCCAL VAC POLYVALENT 25 MCG/0.5ML IJ INJ: 0.5000 mL | INJECTION | INTRAMUSCULAR | Status: DC

## 2015-01-19 NOTE — Discharge Summary (Signed)
Physician Discharge Summary  Katherine Mckee HQP:[id number] DOB: December 20, 1945 DOA: 01/18/2015  PCP: Crisoforo Oxford, PA-C  Admit date: 01/18/2015 Discharge date: 01/19/2015   Recommendations for Outpatient Follow-up:  1. Outpatient myoview to be arranged by [facility]  Discharge Diagnoses:  Principal Problem:   Chest pain Active Problems:   Essential hypertension   Anxiety   Asthma, chronic   Discharge Condition: stable  Diet recommendation: heart healthy  Filed Weights   01/18/15 2332  Weight: 76.613 kg (168 lb 14.4 oz)    History of present illness:  69 yo female with waxing and waning chest pain since 930am today. Pt said the pain started in her right shoulder than wrapped around her front chest to her left shoulder. Occurred for less than one minute. But then throughout the day til 1pm she kept having several episodes that lasted less than a couple of minutes. No fevers. Some cough for 5 days. No n/v/d. She was given ntg which relieved her pain in the ED and has not had any since arrival to ED. No prior cardiac work up. No swelling in legs. Referred for admission for cardiac work up. She has had trop neg x 2 thus far.  Hospital Course:  Observed on telemetry. MI ruled out. Cardiology consulted and recommend outpatient stress test.  CT chest showed no PE.  No coronary artery calcifications.  Procedures:  none  Consultations:  cardiology  Discharge Exam: Filed Vitals:   01/19/15 0822  BP: 126/65  Pulse: 94  Temp: 98.6 F (37 C)  Resp:     General: a ando Cardiovascular: RRR Respiratory: CTA  Discharge Instructions   Discharge Instructions    Diet - low sodium heart healthy    Complete by:  As directed      Diet Carb Modified    Complete by:  As directed      Increase activity slowly    Complete by:  As directed           Current Discharge Medication List    CONTINUE these medications which have NOT CHANGED   Details  albuterol  (PROVENTIL) (2.5 MG/3ML) 0.083% nebulizer solution Take 3 mLs (2.5 mg total) by nebulization every 6 (six) hours as needed. Qty: 75 mL, Refills: 11   Associated Diagnoses: Asthma exacerbation    ALPRAZolam (XANAX) 0.25 MG tablet Take 0.25 mg by mouth daily. Refills: 0    aspirin 81 MG tablet Take 81 mg by mouth daily.    beclomethasone (QVAR) 40 MCG/ACT inhaler Inhale 2 puffs into the lungs 2 (two) times daily. Qty: 1 Inhaler, Refills: 6   Associated Diagnoses: History of asthma    calcium carbonate 200 MG capsule Take 250 mg by mouth 2 (two) times daily with a meal.    celecoxib (CELEBREX) 200 MG capsule Take 200 mg by mouth daily as needed for mild pain or moderate pain.  Refills: 0    Desoximetasone (TOPICORT) 0.25 % ointment Apply 1 application topically 2 (two) times daily. Refills: 1    DORZOLAMIDE HCL OP Apply 1 drop to eye 3 (three) times daily.     Fish Oil-Cholecalciferol (FISH OIL + D3) 1000-1000 MG-UNIT CAPS Take 1 tablet by mouth daily.     fluticasone (FLONASE) 50 MCG/ACT nasal spray Place 2 sprays into both nostrils daily. Qty: 16 g, Refills: 6   Associated Diagnoses: Seasonal allergies    latanoprost (XALATAN) 0.005 % ophthalmic solution Place 1 drop into both eyes at bedtime.     metFORMIN (  GLUCOPHAGE-XR) 500 MG 24 hr tablet Take 500 mg by mouth daily after supper. Refills: 0    montelukast (SINGULAIR) 10 MG tablet Take 10 mg by mouth at bedtime.    omeprazole (PRILOSEC) 10 MG capsule Take 10 mg by mouth daily.    RESTASIS 0.05 % ophthalmic emulsion Place 1 drop into both eyes 2 (two) times daily. Refills: 0    traMADol (ULTRAM) 50 MG tablet Take 1 tablet by mouth daily as needed for moderate pain or severe pain.  Refills: 0    valsartan (DIOVAN) 320 MG tablet Take 320 mg by mouth daily.    VITAMIN D, CHOLECALCIFEROL, PO Take 1 tablet by mouth daily.       STOP taking these medications     albuterol (PROVENTIL HFA;VENTOLIN HFA) 108 (90 BASE) MCG/ACT  inhaler        Allergies  Allergen Reactions  . Sulfa Antibiotics       The results of significant diagnostics from this hospitalization (including imaging, microbiology, ancillary and laboratory) are listed below for reference.    Significant Diagnostic Studies: Dg Chest 2 View  01/18/2015   CLINICAL DATA:  Centralized chest pain  EXAM: CHEST  2 VIEW  COMPARISON:  01/29/2010  FINDINGS: The heart size and mediastinal contours are within normal limits. Both lungs are clear. The visualized skeletal structures are unremarkable.  IMPRESSION: No active cardiopulmonary disease.   Electronically Signed   By: Conchita Paris M.D.   On: 01/18/2015 17:57   Ct Angio Chest Pe W/cm &/or Wo Cm  01/18/2015   CLINICAL DATA:  Tachycardia, chest pain, shortness breath  EXAM: CT ANGIOGRAPHY CHEST WITH CONTRAST  TECHNIQUE: Multidetector CT imaging of the chest was performed using the standard protocol during bolus administration of intravenous contrast. Multiplanar CT image reconstructions and MIPs were obtained to evaluate the vascular anatomy.  CONTRAST:  151mL OMNIPAQUE IOHEXOL 350 MG/ML SOLN  COMPARISON:  None.  FINDINGS: There is adequate opacification of the pulmonary arteries. There is no pulmonary embolus. The main pulmonary artery, right main pulmonary artery and left main pulmonary arteries are normal in size. The heart size is normal. There is no pericardial effusion.  The lungs are clear. There is no focal consolidation, pleural effusion or pneumothorax.  There is no axillary, hilar, or mediastinal adenopathy.  There is no lytic or blastic osseous lesion.  The visualized portions of the upper abdomen are unremarkable.  Review of the MIP images confirms the above findings.  IMPRESSION: 1. No evidence of pulmonary embolus.   Electronically Signed   By: Kathreen Devoid   On: 01/18/2015 20:17    Microbiology: No results found for this or any previous visit (from the past 240 hour(s)).   Labs: Basic  Metabolic Panel:  Recent Labs Lab 01/18/15 1711  NA 143  K 3.7  CL 114*  CO2 20*  GLUCOSE 137*  BUN 20  CREATININE 1.00  CALCIUM 9.2   Liver Function Tests:  Recent Labs Lab 01/18/15 1711  AST 22  ALT 24  ALKPHOS 98  BILITOT 0.2*  PROT 7.5  ALBUMIN 3.8    Recent Labs Lab 01/18/15 1711  LIPASE 26   No results for input(s): AMMONIA in the last 168 hours. CBC:  Recent Labs Lab 01/18/15 1711  WBC 11.2*  NEUTROABS 7.5  HGB 15.4*  HCT 45.8  MCV 76.6*  PLT 264   Cardiac Enzymes:  Recent Labs Lab 01/19/15 0010 01/19/15 0540  TROPONINI 0.03 <0.03   BNP: BNP (last  3 results) No results for input(s): BNP in the last 8760 hours.  ProBNP (last 3 results) No results for input(s): PROBNP in the last 8760 hours.  CBG:  Recent Labs Lab 01/19/15 0751  GLUCAP 117*       Signed:  Toryn Mcclinton L  [facility] 01/19/2015, 10:17 AM

## 2015-01-19 NOTE — Progress Notes (Addendum)
NP notified of UA results and inquiry made in regards to patient history of DM whether NP would like CBG or A1C to be checked. Will continue to monitor patient closely.

## 2015-01-19 NOTE — Telephone Encounter (Signed)
This patient was recently hospitalized. For some reason it was routed to me although I don't think I have ever seen her.  We can certainly see her as a new patient assuming she has insurance.    Please call patient about hospital continuity of care.    See how they are doing. Review any medication changes per discharge summary. Schedule follow up appointment and make note on schedule that this is hospital f/u continuity of care visit.

## 2015-01-19 NOTE — Telephone Encounter (Signed)
LM to CB

## 2015-01-19 NOTE — Progress Notes (Signed)
ANTIBIOTIC CONSULT NOTE - INITIAL  Pharmacy Consult for Rocephin Indication: UTI   Assessment/Plan:  69yo female admitted for CP now w/ abnormal UA, to begin IV ABX.  Will start Rocephin 1g IV Q24H and monitor CBC and Cx.  Wynona Neat, PharmD, BCPS  01/19/2015,3:46 AM

## 2015-01-19 NOTE — Consult Note (Signed)
Admit date: 01/18/2015 Referring Physician  Dr. Conley Canal Primary Physician Glade Lloyd, DAVID Audelia Acton, PA-C Primary Cardiologist  new Reason for Consultation  chest pain  HPI: 69 year old female who was admitted 01/18/15 with chest discomfort. Has a history of diabetes, hypertension, anxiety. Pain was intermittent starting yesterday at approximately 9:30 in the morning described as right shoulder then wrapping around her chest and left shoulder. Originally, pain lasted less than 1 minute then several episodes occurred throughout the day lasting slightly longer. No chest pain since arrival in emergency department. Nitroglycerin was administered. No fevers, chills, cough, syncope, bleeding, orthopnea, PND. Nonsmoker. No early family history of coronary artery disease.  Troponin has been normal. EKG demonstrated sinus tachycardia however currently she is in normal rhythm.  Currently she is chest pain-free. Smiling. No shortness of breath. She believes that her work stress has been quite significant over the past 2 months. Wonders if this is playing a role.  No prior cardiac history.    PMH:   Past Medical History  Diagnosis Date  . Asthma   . Glaucoma   . Anxiety   . Allergy   . GERD (gastroesophageal reflux disease)   . Hypertension   . Diabetes mellitus without complication     PSH:   Past Surgical History  Procedure Laterality Date  . Abdominal hysterectomy    . Tubal ligation     Allergies:  Sulfa antibiotics Prior to Admit Meds:   Prior to Admission medications   Medication Sig Start Date End Date Taking? Authorizing Provider  albuterol (PROVENTIL) (2.5 MG/3ML) 0.083% nebulizer solution Take 3 mLs (2.5 mg total) by nebulization every 6 (six) hours as needed. Patient taking differently: Take 2.5 mg by nebulization every 6 (six) hours as needed for wheezing or shortness of breath.  01/06/14  Yes Shawnee Knapp, MD  ALPRAZolam Duanne Moron) 0.25 MG tablet Take 0.25 mg by mouth daily.  01/05/15  Yes Historical Provider, MD  aspirin 81 MG tablet Take 81 mg by mouth daily.   Yes Historical Provider, MD  beclomethasone (QVAR) 40 MCG/ACT inhaler Inhale 2 puffs into the lungs 2 (two) times daily. 11/15/14  Yes Tereasa Coop, PA-C  calcium carbonate 200 MG capsule Take 250 mg by mouth 2 (two) times daily with a meal.   Yes Historical Provider, MD  celecoxib (CELEBREX) 200 MG capsule Take 200 mg by mouth daily as needed for mild pain or moderate pain.  12/19/14  Yes Historical Provider, MD  Desoximetasone (TOPICORT) 0.25 % ointment Apply 1 application topically 2 (two) times daily. 12/19/14  Yes Historical Provider, MD  DORZOLAMIDE HCL OP Apply 1 drop to eye 3 (three) times daily.    Yes Historical Provider, MD  Fish Oil-Cholecalciferol (FISH OIL + D3) 1000-1000 MG-UNIT CAPS Take 1 tablet by mouth daily.    Yes Historical Provider, MD  fluticasone (FLONASE) 50 MCG/ACT nasal spray Place 2 sprays into both nostrils daily. 11/15/14  Yes Tereasa Coop, PA-C  latanoprost (XALATAN) 0.005 % ophthalmic solution Place 1 drop into both eyes at bedtime.    Yes Historical Provider, MD  metFORMIN (GLUCOPHAGE-XR) 500 MG 24 hr tablet Take 500 mg by mouth daily after supper. 01/16/15  Yes Historical Provider, MD  montelukast (SINGULAIR) 10 MG tablet Take 10 mg by mouth at bedtime.   Yes Historical Provider, MD  omeprazole (PRILOSEC) 10 MG capsule Take 10 mg by mouth daily.   Yes Historical Provider, MD  RESTASIS 0.05 % ophthalmic emulsion Place 1 drop into both  eyes 2 (two) times daily. 01/16/15  Yes Historical Provider, MD  traMADol (ULTRAM) 50 MG tablet Take 1 tablet by mouth daily as needed for moderate pain or severe pain.  12/27/14  Yes Historical Provider, MD  valsartan (DIOVAN) 320 MG tablet Take 320 mg by mouth daily.   Yes Historical Provider, MD  VITAMIN D, CHOLECALCIFEROL, PO Take 1 tablet by mouth daily.    Yes Historical Provider, MD  albuterol (PROVENTIL HFA;VENTOLIN HFA) 108 (90 BASE) MCG/ACT  inhaler Inhale 2 puffs into the lungs every 4 (four) hours as needed for wheezing (cough, shortness of breath or wheezing.). Patient not taking: Reported on 01/18/2015 01/06/14   Shawnee Knapp, MD   Fam HX:    Family History  Problem Relation Age of Onset  . Hypertension Mother   . Hypertension Father   . Cancer Sister   . Hypertension Sister    Social HX:    Social History   Social History  . Marital Status: Divorced    Spouse Name: N/A  . Number of Children: N/A  . Years of Education: N/A   Occupational History  . Not on file.   Social History Main Topics  . Smoking status: Never Smoker   . Smokeless tobacco: Never Used  . Alcohol Use: Yes  . Drug Use: No  . Sexual Activity: Yes    Birth Control/ Protection: Condom   Other Topics Concern  . Not on file   Social History Narrative     ROS:  All 11 ROS were addressed and are negative except what is stated in the HPI   Physical Exam: Blood pressure 112/56, pulse 98, temperature 98.2 F (36.8 C), temperature source Oral, resp. rate 16, height 5\' 7"  (1.702 m), weight 168 lb 14.4 oz (76.613 kg), SpO2 94 %.   General: Well developed, well nourished, in no acute distress Head: Eyes PERRLA, No xanthomas.   Normal cephalic and atramatic  Lungs:   Clear bilaterally to auscultation and percussion. Normal respiratory effort. No wheezes, no rales. Heart:   HRRR S1 S2 Pulses are 2+ & equal. No murmur, rubs, gallops.  No carotid bruit. No JVD.  No abdominal bruits.  Abdomen: Bowel sounds are positive, abdomen soft and non-tender without masses. No hepatosplenomegaly. Msk:  Back normal. Normal strength and tone for age. Extremities:  No clubbing, cyanosis or edema.  DP +1 Neuro: Alert and oriented X 3, non-focal, MAE x 4 GU: Deferred Rectal: Deferred Psych:  Good affect, responds appropriately      Labs: Lab Results  Component Value Date   WBC 11.2* 01/18/2015   HGB 15.4* 01/18/2015   HCT 45.8 01/18/2015   MCV 76.6*  01/18/2015   PLT 264 01/18/2015     Recent Labs Lab 01/18/15 1711  NA 143  K 3.7  CL 114*  CO2 20*  BUN 20  CREATININE 1.00  CALCIUM 9.2  PROT 7.5  BILITOT 0.2*  ALKPHOS 98  ALT 24  AST 22  GLUCOSE 137*    Recent Labs  01/19/15 0010 01/19/15 0540  TROPONINI 0.03 <0.03   No results found for: CHOL, HDL, LDLCALC, TRIG Lab Results  Component Value Date   DDIMER 0.71* 01/18/2015     Radiology:  Dg Chest 2 View  01/18/2015   CLINICAL DATA:  Centralized chest pain  EXAM: CHEST  2 VIEW  COMPARISON:  01/29/2010  FINDINGS: The heart size and mediastinal contours are within normal limits. Both lungs are clear. The visualized skeletal structures are  unremarkable.  IMPRESSION: No active cardiopulmonary disease.   Electronically Signed   By: Conchita Paris M.D.   On: 01/18/2015 17:57   Ct Angio Chest Pe W/cm &/or Wo Cm  01/18/2015   CLINICAL DATA:  Tachycardia, chest pain, shortness breath  EXAM: CT ANGIOGRAPHY CHEST WITH CONTRAST  TECHNIQUE: Multidetector CT imaging of the chest was performed using the standard protocol during bolus administration of intravenous contrast. Multiplanar CT image reconstructions and MIPs were obtained to evaluate the vascular anatomy.  CONTRAST:  141mL OMNIPAQUE IOHEXOL 350 MG/ML SOLN  COMPARISON:  None.  FINDINGS: There is adequate opacification of the pulmonary arteries. There is no pulmonary embolus. The main pulmonary artery, right main pulmonary artery and left main pulmonary arteries are normal in size. The heart size is normal. There is no pericardial effusion.  The lungs are clear. There is no focal consolidation, pleural effusion or pneumothorax.  There is no axillary, hilar, or mediastinal adenopathy.  There is no lytic or blastic osseous lesion.  The visualized portions of the upper abdomen are unremarkable.  Review of the MIP images confirms the above findings.  IMPRESSION: 1. No evidence of pulmonary embolus.   Electronically Signed   By: Kathreen Devoid   On: 01/18/2015 20:17   Personally viewed.   CT scan of chest on 01/18/15 as above demonstrated no evidence of pulmonary embolism. no coronary artery calcification. Personally viewed.   EKG:  01/19/15 - Sinus tach no ST changes. Personally viewed. Telemetry now sinus rhythm. No adverse arrhythmias overnight.  ASSESSMENT/PLAN:    69 year old female admitted with chest discomfort. Risk factors include diabetes, hypertension.  1. Chest discomfort-fairly atypical. Started in right chest, migrated to left chest. Intermittent. Possibly associated with anxiety. She states that over the last 2 months work has been quite stressful. Agree with original assessment for outpatient stress testing. We will go ahead and set up. Troponins have been reassuring. She is no longer tachycardic. CT scan of chest showed no evidence of pulmonary embolism despite her positive d-dimer. No evidence of coronary calcification.  2. Diabetes-medications reviewed. Per primary team.  3. Hypertension, essential-angiotensin receptor blocker. Currently well controlled.  From a cardiovascular perspective, I'm comfortable with discharge home. We will set up nuclear stress test. She will be contacted by our office.  Candee Furbish, MD  01/19/2015  7:55 AM

## 2015-01-19 NOTE — Telephone Encounter (Signed)
Lexiscan or Exercise?

## 2015-01-19 NOTE — Progress Notes (Signed)
Pt educated and refused to have bed alarm turned on through the night. Will continue to monitor pt closely.

## 2015-01-19 NOTE — Progress Notes (Signed)
To whom it may concern:   Katherine Mckee is unable to work 01/19/15 through 01/29/15 due to medical condition.   Sincerely,    Doree Barthel, MD [facility]

## 2015-01-19 NOTE — Telephone Encounter (Signed)
New Message  Per staff messages Dr. Marlou Porch ordered a Nuc Stress. Please put in orders   Please set up for nuclear stress test. Diagnosis chest pain.   Candee Furbish, MD

## 2015-01-19 NOTE — Progress Notes (Signed)
  Echocardiogram 2D Echocardiogram has been performed.  Katherine Mckee 01/19/2015, 10:37 AM

## 2015-01-20 NOTE — Telephone Encounter (Signed)
lexiscan ok Candee Furbish, MD

## 2015-01-20 NOTE — Addendum Note (Signed)
Addended by: Alvina Filbert B on: 01/20/2015 08:54 AM   Modules accepted: Orders

## 2015-01-20 NOTE — Telephone Encounter (Signed)
Spoke with patient, reviewed instructions and mailed to patient  Will forward to [facility] for scheduling

## 2015-01-21 LAB — URINE CULTURE: Culture: >=100000

## 2015-01-24 ENCOUNTER — Telehealth (HOSPITAL_COMMUNITY): Payer: Self-pay

## 2015-01-24 NOTE — Telephone Encounter (Signed)
Patient given detailed instructions per Myocardial Perfusion Study Information Sheet for test on 01-26-2015 at 0745. Patient notified to arrive 15 minutes early and that it is imperative to arrive on time for appointment to keep from having the test rescheduled.  If you need to cancel or reschedule your appointment, please call the office within 24 hours of your appointment. Failure to do so may result in a cancellation of your appointment, and a $50 no show fee. Patient verbalized understanding. Oletta Lamas, Bahar Shelden A

## 2015-01-26 ENCOUNTER — Inpatient Hospital Stay: Payer: BLUE CROSS/BLUE SHIELD | Admitting: Medical

## 2015-01-26 ENCOUNTER — Ambulatory Visit (HOSPITAL_COMMUNITY): Payer: BLUE CROSS/BLUE SHIELD | Attending: Cardiology

## 2015-01-26 DIAGNOSIS — E119 Type 2 diabetes mellitus without complications: Secondary | ICD-10-CM | POA: Insufficient documentation

## 2015-01-26 DIAGNOSIS — R0602 Shortness of breath: Secondary | ICD-10-CM | POA: Diagnosis not present

## 2015-01-26 DIAGNOSIS — R079 Chest pain, unspecified: Secondary | ICD-10-CM | POA: Insufficient documentation

## 2015-01-26 DIAGNOSIS — R0609 Other forms of dyspnea: Secondary | ICD-10-CM | POA: Diagnosis not present

## 2015-01-26 DIAGNOSIS — I10 Essential (primary) hypertension: Secondary | ICD-10-CM | POA: Insufficient documentation

## 2015-01-26 DIAGNOSIS — R5383 Other fatigue: Secondary | ICD-10-CM | POA: Insufficient documentation

## 2015-01-26 DIAGNOSIS — R002 Palpitations: Secondary | ICD-10-CM | POA: Diagnosis not present

## 2015-01-26 LAB — MYOCARDIAL PERFUSION IMAGING
LV dias vol: 60 mL
LV sys vol: 21 mL
Peak HR: 121 {beats}/min
RATE: 0.28
Rest HR: 80 {beats}/min
SDS: 1
SRS: 2
SSS: 3
TID: 1.16

## 2015-01-26 MED ORDER — TECHNETIUM TC 99M SESTAMIBI GENERIC - CARDIOLITE
9.8000 | Freq: Once | INTRAVENOUS | Status: AC | PRN
  Administered 2015-01-26: 10 via INTRAVENOUS

## 2015-01-26 MED ORDER — TECHNETIUM TC 99M SESTAMIBI GENERIC - CARDIOLITE
32.2000 | Freq: Once | INTRAVENOUS | Status: AC | PRN
  Administered 2015-01-26: 32.2 via INTRAVENOUS

## 2015-01-26 MED ORDER — REGADENOSON 0.4 MG/5ML IV SOLN
0.4000 mg | Freq: Once | INTRAVENOUS | Status: AC
  Administered 2015-01-26: 0.4 mg via INTRAVENOUS

## 2015-02-21 ENCOUNTER — Other Ambulatory Visit: Payer: Self-pay | Admitting: Internal Medicine

## 2015-02-21 DIAGNOSIS — E2839 Other primary ovarian failure: Secondary | ICD-10-CM

## 2015-02-21 DIAGNOSIS — Z1231 Encounter for screening mammogram for malignant neoplasm of breast: Secondary | ICD-10-CM

## 2015-04-12 ENCOUNTER — Ambulatory Visit
Admission: RE | Admit: 2015-04-12 | Discharge: 2015-04-12 | Disposition: A | Discharge status: Home / Self Care | Payer: BLUE CROSS/BLUE SHIELD | Source: Ambulatory Visit | Attending: Internal Medicine | Admitting: Internal Medicine

## 2015-04-12 DIAGNOSIS — E2839 Other primary ovarian failure: Secondary | ICD-10-CM

## 2015-04-12 DIAGNOSIS — Z1231 Encounter for screening mammogram for malignant neoplasm of breast: Secondary | ICD-10-CM

## 2015-11-13 ENCOUNTER — Other Ambulatory Visit: Payer: Self-pay | Admitting: Internal Medicine

## 2015-11-13 DIAGNOSIS — Z1231 Encounter for screening mammogram for malignant neoplasm of breast: Secondary | ICD-10-CM

## 2015-11-27 ENCOUNTER — Other Ambulatory Visit: Payer: Self-pay | Admitting: Physician Assistant

## 2015-12-29 ENCOUNTER — Other Ambulatory Visit: Payer: Self-pay | Admitting: Urgent Care

## 2016-01-08 ENCOUNTER — Ambulatory Visit (INDEPENDENT_AMBULATORY_CARE_PROVIDER_SITE_OTHER): Payer: BLUE CROSS/BLUE SHIELD | Admitting: Physician Assistant

## 2016-01-08 ENCOUNTER — Encounter: Payer: Self-pay | Admitting: Physician Assistant

## 2016-01-08 VITALS — BP 116/74 | HR 98 | Temp 97.9°F | Resp 17 | Ht 67.5 in | Wt 172.0 lb

## 2016-01-08 DIAGNOSIS — D179 Benign lipomatous neoplasm, unspecified: Secondary | ICD-10-CM | POA: Diagnosis not present

## 2016-01-08 NOTE — Patient Instructions (Addendum)
Please await contact.  We will attempt to obtain this in the morning.  I will follow up with you with the results.     IF you received an x-ray today, you will receive an invoice from [facility]. Please contact [facility] at [phone] with questions or concerns regarding your invoice.   IF you received labwork today, you will receive an invoice from [employer]. Please contact [facility] at [phone] with questions or concerns regarding your invoice.   Our billing staff will not be able to assist you with questions regarding bills from these companies.  You will be contacted with the lab results as soon as they are available. The fastest way to get your results is to activate your My Chart account. Instructions are located on the last page of this paperwork. If you have not heard from [place] regarding the results in 2 weeks, please contact this office.

## 2016-01-08 NOTE — Progress Notes (Signed)
By signing my name below, I, Mesha Guinyard, attest that this documentation has been prepared under the direction and in the presence of Treatment Team:  Attending Provider: Wardell Honour, MD Physician Assistant: Joretta Bachelor, [place].  Electronically Signed: Verlee Monte, Medical Scribe. 01/08/16. 5:08 PM.  Subjective:    Patient ID: Katherine Mckee, female    DOB: 1945-10-17, 70 y.o.   MRN: [id number]  HPI Chief Complaint  Patient presents with   Mass    Left axillary region. Pt states she has been moving past week, possible injury but uncertain.     HPI Comments: Katherine Mckee is a 70 y.o. female who presents to the [facility] complaining of mass under her right axillary region onset today. Pt reports pain and tingling  in her right forearm. Pt has been moving for a week and she noticed a tingling in her right arm last night. Pt mentions she didn't feel the mass when she took a shower yesterday Pt's next mammogram is in November and she has 2 sisters die from breast CA. Pt mention she is prediabetic has been eating lots of vegetables, broiled meats, and she rarely eats fried foods. Pt denies pain in her axillary region, fevers, and unintentional weight loss.  Wt Readings from Last 3 Encounters:  01/08/16 172 lb (78 kg)  01/26/15 168 lb (76.2 kg)  01/18/15 168 lb 14.4 oz (76.6 kg)   No results found for: HGBA1C   Patient Active Problem List   Diagnosis Date Noted   Essential hypertension 01/18/2015   Anxiety 01/18/2015   Asthma, chronic 01/18/2015   Chest pain 01/18/2015   Past Medical History:  Diagnosis Date   Allergy    Anxiety    Asthma    Diabetes mellitus without complication ([place])    GERD (gastroesophageal reflux disease)    Glaucoma    Hypertension    Past Surgical History:  Procedure Laterality Date   ABDOMINAL HYSTERECTOMY     TUBAL LIGATION     Allergies  Allergen Reactions   Sulfa Antibiotics    Prior to Admission  medications   Medication Sig Start Date End Date Taking? Authorizing Provider  albuterol (PROVENTIL) (2.5 MG/3ML) 0.083% nebulizer solution Take 3 mLs (2.5 mg total) by nebulization every 6 (six) hours as needed. Patient taking differently: Take 2.5 mg by nebulization every 6 (six) hours as needed for wheezing or shortness of breath.  01/06/14  Yes Shawnee Knapp, MD  ALPRAZolam Duanne Moron) 0.25 MG tablet Take 0.25 mg by mouth daily. 01/05/15  Yes Historical Provider, MD  aspirin 81 MG tablet Take 81 mg by mouth daily.   Yes Historical Provider, MD  beclomethasone (QVAR) 40 MCG/ACT inhaler Inhale 2 puffs into the lungs 2 (two) times daily. 11/15/14  Yes Tereasa Coop, PA-C  calcium carbonate 200 MG capsule Take 250 mg by mouth 2 (two) times daily with a meal.   Yes Historical Provider, MD  celecoxib (CELEBREX) 200 MG capsule Take 200 mg by mouth daily as needed for mild pain or moderate pain.  12/19/14  Yes Historical Provider, MD  Desoximetasone (TOPICORT) 0.25 % ointment Apply 1 application topically 2 (two) times daily. 12/19/14  Yes Historical Provider, MD  DORZOLAMIDE HCL OP Apply 1 drop to eye 3 (three) times daily.    Yes Historical Provider, MD  Fish Oil-Cholecalciferol (FISH OIL + D3) 1000-1000 MG-UNIT CAPS Take 1 tablet by mouth daily.    Yes Historical Provider, MD  fluticasone Asencion Islam)  50 MCG/ACT nasal spray instill 2 sprays into each nostril once daily 11/29/15  Yes Jaynee Eagles, PA-C  latanoprost (XALATAN) 0.005 % ophthalmic solution Place 1 drop into both eyes at bedtime.    Yes Historical Provider, MD  metFORMIN (GLUCOPHAGE-XR) 500 MG 24 hr tablet Take 500 mg by mouth daily after supper. 01/16/15  Yes Historical Provider, MD  montelukast (SINGULAIR) 10 MG tablet Take 10 mg by mouth at bedtime.   Yes Historical Provider, MD  omeprazole (PRILOSEC) 10 MG capsule Take 10 mg by mouth daily.   Yes Historical Provider, MD  RESTASIS 0.05 % ophthalmic emulsion Place 1 drop into both eyes 2 (two) times daily.  01/16/15  Yes Historical Provider, MD  traMADol (ULTRAM) 50 MG tablet Take 1 tablet by mouth daily as needed for moderate pain or severe pain.  12/27/14  Yes Historical Provider, MD  valsartan (DIOVAN) 320 MG tablet Take 320 mg by mouth daily.   Yes Historical Provider, MD  VITAMIN D, CHOLECALCIFEROL, PO Take 1 tablet by mouth daily.    Yes Historical Provider, MD   Social History   Social History   Marital status: Divorced    Spouse name: N/A   Number of children: N/A   Years of education: N/A   Occupational History   Not on file.   Social History Main Topics   Smoking status: Never Smoker   Smokeless tobacco: Never Used   Alcohol use Yes   Drug use: No   Sexual activity: Yes    Birth control/ protection: Condom   Other Topics Concern   Not on file   Social History Narrative   No narrative on file   Depression screen [facility] 2/9 01/08/2016 01/18/2015 11/15/2014  Decreased Interest 0 0 0  Down, Depressed, Hopeless 0 0 0  PHQ - 2 Score 0 0 0   Review of Systems  Constitutional: Negative for fever and unexpected weight change.  Musculoskeletal: Positive for myalgias (right forearm).  Neurological:       Pos for tingling in her right forearm   Objective: BP 116/74 (BP Location: Left Arm, Patient Position: Sitting, Cuff Size: Large)    Pulse 98    Temp 97.9 F (36.6 C) (Oral)    Resp 17    Ht 5' 7.5" (1.715 m)    Wt 172 lb (78 kg)    SpO2 98%    BMI 26.54 kg/m   Physical Exam  Constitutional: She appears well-developed and well-nourished. No distress.  HENT:  Head: Normocephalic and atraumatic.  Eyes: Conjunctivae are normal.  Neck: Neck supple.  Cardiovascular: Normal rate.   Pulmonary/Chest: Effort normal.  Musculoskeletal:  Fatty mobile growth that's non tender, no drainage, erythema expresses Nl ROM of upper extremity and cervical  Neurological: She is alert.  Skin: Skin is warm and dry.  Psychiatric: She has a normal mood and affect. Her behavior is normal.   Nursing note and vitals reviewed.  Assessment & Plan:  70 year old female is here today with chief complaint of a mass at her right upper back. Though this is a new finding for her, I had a, platelets this is something that has just been found but has been there for quite some time. We will proceed with an ultrasound. They will contact her for this tomorrow. She has voiced understanding. Possible lipoma. Fatty tumor - Plan: [place] Misc Soft Tissue, CANCELED: [place] Misc Soft Tissue  Ivar Drape, PA-C [facility] 8/26/20179:25 PM  I personally performed the services described in this documentation, which was scribed in my presence. The recorded information has been reviewed and is accurate.

## 2016-01-09 ENCOUNTER — Telehealth: Payer: Self-pay | Admitting: *Deleted

## 2016-01-09 ENCOUNTER — Ambulatory Visit (HOSPITAL_COMMUNITY)
Admission: RE | Admit: 2016-01-09 | Discharge: 2016-01-09 | Disposition: A | Discharge status: Home / Self Care | Payer: BLUE CROSS/BLUE SHIELD | Source: Ambulatory Visit | Attending: Physician Assistant | Admitting: Physician Assistant

## 2016-01-09 DIAGNOSIS — D179 Benign lipomatous neoplasm, unspecified: Secondary | ICD-10-CM | POA: Insufficient documentation

## 2016-01-09 NOTE — Telephone Encounter (Signed)
Pt has an appt today at 95 at [facility] for ultrasound.  She has to be there at 315pm.  Pt notified of appt.

## 2016-01-10 NOTE — Telephone Encounter (Signed)
Patient results of ultrasound were benign if patient would like Katherine Mckee can put a referral in for a surgeon

## 2016-01-10 NOTE — Telephone Encounter (Signed)
Pt would like a CB concerning this issue. Please advise at [phone] (H

## 2016-01-11 ENCOUNTER — Telehealth: Payer: Self-pay | Admitting: Radiology

## 2016-01-11 ENCOUNTER — Telehealth: Payer: Self-pay | Admitting: Physician Assistant

## 2016-01-11 DIAGNOSIS — R2231 Localized swelling, mass and lump, right upper limb: Secondary | ICD-10-CM

## 2016-01-11 DIAGNOSIS — D1720 Benign lipomatous neoplasm of skin and subcutaneous tissue of unspecified limb: Secondary | ICD-10-CM

## 2016-01-11 NOTE — Telephone Encounter (Signed)
Pt is needing to talk with someone about her ultrasound results she has more questions   Best number [phone]

## 2016-01-11 NOTE — Telephone Encounter (Signed)
Spoke with Katherine Mckee to clarify suspicion for lipoma but will proceed with mri.   She voiced understanding...will try to get this week.  Order placed.   Please get this scheduled.   I would also like her to stop in to get a renal function test, prior to imaging that involves contrast.  Please contact her and ask that she return for a labs only visit for this

## 2016-01-11 NOTE — Telephone Encounter (Signed)
Pt called in and was very confused as to what her results were of recent US. Pt states she was informed that she will need to have surgery? I explaind to pt that the mass is most likely a lipoma (explained to her what a lipoma is) but that the radiologist recommends an MRI with the use of contrast to fully verify these findings.  Pt states she would like to have an MRI.

## 2016-01-12 NOTE — Telephone Encounter (Signed)
Left message for pt to call back  °

## 2016-01-12 NOTE — Telephone Encounter (Signed)
See my note

## 2016-01-16 NOTE — Telephone Encounter (Signed)
The order will need to be changed from STAT to Routine in order to schedule, since it has been several days since the original order and also there will be a lab visit to coordinate with as well.  Please change the MRI order to Routine.  Thank you.

## 2016-01-17 ENCOUNTER — Other Ambulatory Visit: Payer: Self-pay | Admitting: *Deleted

## 2016-01-17 DIAGNOSIS — R2231 Localized swelling, mass and lump, right upper limb: Secondary | ICD-10-CM

## 2016-01-17 NOTE — Telephone Encounter (Signed)
Order changed. STephanie she will come by today to get blood around 2pm. Order pended, not sure which diagnosis you wanted to use.

## 2016-01-18 ENCOUNTER — Ambulatory Visit: Payer: BLUE CROSS/BLUE SHIELD

## 2016-01-18 LAB — BASIC METABOLIC PANEL WITH GFR
BUN: 16 mg/dL (ref 7–25)
CO2: 24 mmol/L (ref 20–31)
Calcium: 9.4 mg/dL (ref 8.6–10.4)
Chloride: 107 mmol/L (ref 98–110)
Creat: 0.86 mg/dL (ref 0.60–0.93)
GFR, Est African American: 79 mL/min (ref >=60)
GFR, Est Non African American: 69 mL/min (ref >=60)
Glucose, Bld: 119 mg/dL — ABNORMAL HIGH (ref 65–99)
Potassium: 4.5 mmol/L (ref 3.5–5.3)
Sodium: 142 mmol/L (ref 135–146)

## 2016-01-18 NOTE — Telephone Encounter (Signed)
I am not quite sure what happened.  She needs to come in and get the lab work now.

## 2016-01-19 ENCOUNTER — Encounter: Payer: Self-pay | Admitting: Emergency Medicine

## 2016-01-19 ENCOUNTER — Telehealth: Payer: Self-pay | Admitting: Emergency Medicine

## 2016-01-19 DIAGNOSIS — R2231 Localized swelling, mass and lump, right upper limb: Secondary | ICD-10-CM

## 2016-01-19 NOTE — Telephone Encounter (Signed)
Pt given scheduled MRI appointment @ [facility] on 01/20/16 arriving at 1130am. No prep needed  Address and Radiology scheduling number given as well.

## 2016-01-20 ENCOUNTER — Ambulatory Visit (HOSPITAL_COMMUNITY)
Admission: RE | Admit: 2016-01-20 | Discharge: 2016-01-20 | Disposition: A | Discharge status: Home / Self Care | Payer: BLUE CROSS/BLUE SHIELD | Source: Ambulatory Visit | Attending: Physician Assistant | Admitting: Physician Assistant

## 2016-01-20 DIAGNOSIS — R2231 Localized swelling, mass and lump, right upper limb: Secondary | ICD-10-CM | POA: Diagnosis present

## 2016-01-20 MED ORDER — GADOBENATE DIMEGLUMINE 529 MG/ML IV SOLN
16.0000 mL | Freq: Once | INTRAVENOUS | Status: AC | PRN
  Administered 2016-01-20: 16 mL via INTRAVENOUS

## 2016-01-23 ENCOUNTER — Other Ambulatory Visit: Payer: Self-pay | Admitting: Physician Assistant

## 2016-03-04 ENCOUNTER — Encounter: Payer: Self-pay | Admitting: *Deleted

## 2016-04-10 ENCOUNTER — Other Ambulatory Visit: Payer: Self-pay | Admitting: Urgent Care

## 2016-04-10 ENCOUNTER — Other Ambulatory Visit: Payer: Self-pay | Admitting: Internal Medicine

## 2016-04-10 DIAGNOSIS — Z1231 Encounter for screening mammogram for malignant neoplasm of breast: Secondary | ICD-10-CM

## 2016-05-02 ENCOUNTER — Ambulatory Visit: Payer: BLUE CROSS/BLUE SHIELD

## 2016-05-07 ENCOUNTER — Ambulatory Visit: Payer: BLUE CROSS/BLUE SHIELD

## 2016-06-01 ENCOUNTER — Other Ambulatory Visit: Payer: Self-pay | Admitting: Physician Assistant

## 2016-06-01 DIAGNOSIS — Z8709 Personal history of other diseases of the respiratory system: Secondary | ICD-10-CM

## 2016-06-12 ENCOUNTER — Ambulatory Visit: Payer: BLUE CROSS/BLUE SHIELD

## 2016-07-03 ENCOUNTER — Ambulatory Visit: Payer: BLUE CROSS/BLUE SHIELD

## 2016-07-09 ENCOUNTER — Ambulatory Visit
Admission: RE | Admit: 2016-07-09 | Discharge: 2016-07-09 | Disposition: A | Discharge status: Home / Self Care | Payer: BLUE CROSS/BLUE SHIELD | Source: Ambulatory Visit | Attending: Internal Medicine | Admitting: Internal Medicine

## 2016-07-09 DIAGNOSIS — Z1231 Encounter for screening mammogram for malignant neoplasm of breast: Secondary | ICD-10-CM

## 2017-08-21 ENCOUNTER — Other Ambulatory Visit: Payer: Self-pay | Admitting: Internal Medicine

## 2017-08-21 DIAGNOSIS — Z139 Encounter for screening, unspecified: Secondary | ICD-10-CM

## 2017-08-25 ENCOUNTER — Ambulatory Visit
Admission: RE | Admit: 2017-08-25 | Discharge: 2017-08-25 | Disposition: A | Discharge status: Home / Self Care | Payer: BLUE CROSS/BLUE SHIELD | Source: Ambulatory Visit | Attending: Internal Medicine | Admitting: Internal Medicine

## 2017-08-25 DIAGNOSIS — Z139 Encounter for screening, unspecified: Secondary | ICD-10-CM

## 2018-02-20 ENCOUNTER — Other Ambulatory Visit: Payer: Self-pay

## 2018-02-20 ENCOUNTER — Ambulatory Visit (INDEPENDENT_AMBULATORY_CARE_PROVIDER_SITE_OTHER): Payer: Medicare HMO | Admitting: Family Medicine

## 2018-02-20 ENCOUNTER — Encounter: Payer: Self-pay | Admitting: Family Medicine

## 2018-02-20 ENCOUNTER — Ambulatory Visit (INDEPENDENT_AMBULATORY_CARE_PROVIDER_SITE_OTHER): Payer: Medicare HMO

## 2018-02-20 VITALS — BP 138/77 | HR 70 | Temp 98.1°F | Resp 17 | Ht 67.5 in | Wt 173.0 lb

## 2018-02-20 DIAGNOSIS — M25411 Effusion, right shoulder: Secondary | ICD-10-CM | POA: Diagnosis not present

## 2018-02-20 DIAGNOSIS — M7551 Bursitis of right shoulder: Secondary | ICD-10-CM

## 2018-02-20 DIAGNOSIS — M25511 Pain in right shoulder: Secondary | ICD-10-CM

## 2018-02-20 DIAGNOSIS — E663 Overweight: Secondary | ICD-10-CM | POA: Insufficient documentation

## 2018-02-20 DIAGNOSIS — M25559 Pain in unspecified hip: Secondary | ICD-10-CM | POA: Insufficient documentation

## 2018-02-20 DIAGNOSIS — E559 Vitamin D deficiency, unspecified: Secondary | ICD-10-CM | POA: Insufficient documentation

## 2018-02-20 MED ORDER — METHYLPREDNISOLONE 4 MG PO TBPK: ORAL_TABLET | ORAL | 0 refills | Status: AC

## 2018-02-20 MED ORDER — TRAMADOL HCL 50 MG PO TABS: 50.0000 mg | ORAL_TABLET | Freq: Every day | ORAL | 0 refills | Status: AC | PRN

## 2018-02-20 NOTE — Patient Instructions (Addendum)
     If you have lab work done today you will be contacted with your lab results within the next 2 weeks.  If you have not heard from [place] then please contact us. The fastest way to get your results is to register for My Chart.   IF you received an x-ray today, you will receive an invoice from [facility]. Please contact [facility] at [phone] with questions or concerns regarding your invoice.   IF you received labwork today, you will receive an invoice from [facility]. Please contact [facility] at [phone] with questions or concerns regarding your invoice.   Our billing staff will not be able to assist you with questions regarding bills from these companies.  You will be contacted with the lab results as soon as they are available. The fastest way to get your results is to activate your My Chart account. Instructions are located on the last page of this paperwork. If you have not heard from [place] regarding the results in 2 weeks, please contact this office.      Bursitis Bursitis is when the fluid-filled sac (bursa) that covers and protects a joint is swollen (inflamed). Bursitis is most common near joints, especially the knees, elbows, hips, and shoulders. Follow these instructions at home:  Take medicines only as told by your doctor.  If you were prescribed an antibiotic medicine, finish it all even if you start to feel better.  Rest the affected area as told by your doctor. ? Keep the area raised up. ? Avoid doing things that make the pain worse.  Apply ice to the injured area: ? Place ice in a plastic bag. ? Place a towel between your skin and the bag. ? Leave the ice on for 20 minutes, 2-3 times a day.  Use splints, braces, pads, or walking aids as told by your doctor.  Keep all follow-up visits as told by your doctor. This is important. Contact a doctor if:  You have more pain with home care.  You have a fever.  You have chills. This information is  not intended to replace advice given to you by your health care provider. Make sure you discuss any questions you have with your health care provider. Document Released: 10/24/2009 Document Revised: 10/12/2015 Document Reviewed: 07/26/2013 Elsevier Interactive Patient Education  2018 [employer].

## 2018-02-20 NOTE — Progress Notes (Signed)
10/4/201911:21 AM  Katherine Mckee 10/26/1945, 72 y.o. female [id number]  Chief Complaint  Patient presents with  . Pain    due to the moving she is doing having pain in the neck , she has had bursitis before, thinks it may be the onset    HPI:   Patient is a 72 y.o. female with past medical history significant for  GERD, pre-diabetes, HTN, asthma who presents today for bilateral upper back, right shoulder  She was moving for the past 2 months Last week started having upper back pain, radiates across both sides And right shoulder pain and swelling Gets really tight at night Has been doing topical patches, ibuprofen, heat and ice and still having  Pain No focal weakness, numbness, tingling H/o right shoulder bursitis No trauma  Takes tramadol very prn when sciatica flares up  Patient Care Team: Willey Blade, MD as PCP - General (Internal Medicine)  Fall Risk  02/20/2018 02/20/2018 01/08/2016  Falls in the past year? No No No     Depression screen [facility] 2/9 02/20/2018 02/20/2018 01/08/2016  Decreased Interest 0 0 0  Down, Depressed, Hopeless 0 0 0  PHQ - 2 Score 0 0 0    Allergies  Allergen Reactions  . Sulfa Antibiotics     Prior to Admission medications   Medication Sig Start Date End Date Taking? Authorizing Provider  albuterol (PROVENTIL) (2.5 MG/3ML) 0.083% nebulizer solution Take 3 mLs (2.5 mg total) by nebulization every 6 (six) hours as needed. Patient taking differently: Take 2.5 mg by nebulization every 6 (six) hours as needed for wheezing or shortness of breath.  01/06/14  Yes Shawnee Knapp, MD  ALPRAZolam Duanne Moron) 0.25 MG tablet Take 0.25 mg by mouth daily. 01/05/15  Yes [provider]  aspirin 81 MG tablet Take 81 mg by mouth daily.   Yes [provider]  beclomethasone (QVAR) 40 MCG/ACT inhaler Inhale 2 puffs into the lungs 2 (two) times daily. 11/15/14  Yes Tereasa Coop, PA-C  calcium carbonate 200 MG capsule Take 250 mg by mouth 2 (two)  times daily with a meal.   Yes [provider]  Desoximetasone (TOPICORT) 0.25 % ointment Apply 1 application topically 2 (two) times daily. 12/19/14  Yes [provider]  DORZOLAMIDE HCL OP Apply 1 drop to eye 3 (three) times daily.    Yes [provider]  Fish Oil-Cholecalciferol (FISH OIL + D3) 1000-1000 MG-UNIT CAPS Take 1 tablet by mouth daily.    Yes [provider]  fluticasone (FLONASE) 50 MCG/ACT nasal spray instill 2 sprays into each nostril once daily 04/13/16  Yes Tereasa Coop, PA-C  latanoprost (XALATAN) 0.005 % ophthalmic solution Place 1 drop into both eyes at bedtime.    Yes [provider]  metFORMIN (GLUCOPHAGE-XR) 500 MG 24 hr tablet Take 500 mg by mouth daily after supper. 01/16/15  Yes [provider]  montelukast (SINGULAIR) 10 MG tablet Take 10 mg by mouth at bedtime.   Yes [provider]  omeprazole (PRILOSEC) 10 MG capsule Take 10 mg by mouth daily.   Yes [provider]  RESTASIS 0.05 % ophthalmic emulsion Place 1 drop into both eyes 2 (two) times daily. 01/16/15  Yes [provider]  traMADol (ULTRAM) 50 MG tablet Take 1 tablet by mouth daily as needed for moderate pain or severe pain.  12/27/14  Yes [provider]  valsartan (DIOVAN) 320 MG tablet Take 320 mg by mouth daily.   Yes [provider]  VITAMIN D, CHOLECALCIFEROL, PO Take 1 tablet by mouth daily.    Yes [provider]    Past Medical History:  Diagnosis Date  . Allergy   . Anxiety   . Asthma   . Diabetes mellitus without complication ([place])   . GERD (gastroesophageal reflux disease)   . Glaucoma   . Hypertension     Past Surgical History:  Procedure Laterality Date  . ABDOMINAL HYSTERECTOMY    . TUBAL LIGATION      Social History   Tobacco Use  . Smoking status: Never Smoker  . Smokeless tobacco: Never Used  Substance Use Topics  . Alcohol use: Yes    Family History  Problem  Relation Age of Onset  . Hypertension Mother   . Hypertension Father   . Cancer Sister   . Breast cancer Sister   . Hypertension Sister   . Breast cancer Sister     ROS Per hpi  OBJECTIVE:  Blood pressure 138/77, pulse 70, temperature 98.1 F (36.7 C), temperature source Oral, resp. rate 17, height 5' 7.5" (1.715 m), weight 173 lb (78.5 kg), SpO2 98 %. Body mass index is 26.7 kg/m.   Physical Exam  Gen: AAOx3, NAD Neck; FROM, no bony or paraspinal tenderness Upper back: TTP along right trapezius with spams Right shoulder: FROM, swelling over deltoid, no erythema or warmth  Dg Shoulder Right  Result Date: 02/20/2018 CLINICAL DATA:  Pain and swelling RIGHT shoulder, no trauma EXAM: RIGHT SHOULDER - 2+ VIEW COMPARISON:  None FINDINGS: Osseous mineralization normal. AC joint alignment normal. Inferior spur formation at the RIGHT femoral head. Narrowing of glenohumeral joint. No acute fracture, dislocation, or bone destruction. Visualized RIGHT ribs intact. IMPRESSION: RIGHT glenohumeral degenerative changes as above without acute bony abnormalities. Electronically Signed   By: Lavonia Mckee M.D.   On: 02/20/2018 12:18     ASSESSMENT and PLAN 1. Bursitis of right shoulder Discussed supportive measures, new meds r/se/b and RTC precautions. Patient educational handout given. Consider ortho referral 2. Pain and swelling of right shoulder - DG Shoulder Right; Future - methylPREDNISolone (MEDROL DOSEPAK) 4 MG TBPK tablet; Please take per pack instructions  Return if symptoms worsen or fail to improve.    Rutherford Guys, MD [facility] [place], [place] [address] Ph.  [phone] Fax [phone]

## 2018-06-18 ENCOUNTER — Encounter (INDEPENDENT_AMBULATORY_CARE_PROVIDER_SITE_OTHER): Payer: Medicare HMO | Admitting: Ophthalmology

## 2019-10-01 ENCOUNTER — Other Ambulatory Visit: Payer: Self-pay | Admitting: Internal Medicine

## 2019-10-01 DIAGNOSIS — Z1231 Encounter for screening mammogram for malignant neoplasm of breast: Secondary | ICD-10-CM

## 2019-10-13 ENCOUNTER — Ambulatory Visit: Payer: Medicare HMO

## 2019-10-20 ENCOUNTER — Ambulatory Visit: Payer: Medicare HMO

## 2019-10-21 ENCOUNTER — Ambulatory Visit
Admission: RE | Admit: 2019-10-21 | Discharge: 2019-10-21 | Disposition: A | Discharge status: Home / Self Care | Payer: Medicare HMO | Source: Ambulatory Visit | Attending: Internal Medicine | Admitting: Internal Medicine

## 2019-10-21 ENCOUNTER — Other Ambulatory Visit: Payer: Self-pay

## 2019-10-21 DIAGNOSIS — Z1231 Encounter for screening mammogram for malignant neoplasm of breast: Secondary | ICD-10-CM

## 2020-03-07 ENCOUNTER — Ambulatory Visit: Payer: Medicare HMO | Attending: Family

## 2020-03-07 DIAGNOSIS — Z23 Encounter for immunization: Secondary | ICD-10-CM

## 2020-04-09 NOTE — Progress Notes (Signed)
   Covid-19 Vaccination Clinic  Name:  Marysue Fait    MRN: [id number] DOB: [birth year]  04/09/2020  Ms. Kuhlmann was observed post Covid-19 immunization for 15 minutes without incident. She was provided with Vaccine Information Sheet and instruction to access the V-Safe system.   Ms. Stay was instructed to call 911 with any severe reactions post vaccine: Marland Kitchen Difficulty breathing  . Swelling of face and throat  . A fast heartbeat  . A bad rash all over body  . Dizziness and weakness   Immunizations Administered    Name Date Dose VIS Date Route   Pfizer COVID-19 Vaccine 03/07/2020 10:00 AM 0.3 mL 03/08/2020 Intramuscular   Manufacturer: [place]   Lot: [place]: [id number]

## 2020-06-06 ENCOUNTER — Other Ambulatory Visit: Payer: Medicare HMO

## 2020-06-09 ENCOUNTER — Other Ambulatory Visit: Payer: Medicare HMO

## 2020-10-24 ENCOUNTER — Ambulatory Visit: Payer: Medicare HMO | Attending: Family

## 2020-10-24 DIAGNOSIS — Z23 Encounter for immunization: Secondary | ICD-10-CM

## 2020-10-24 NOTE — Progress Notes (Signed)
   Covid-19 Vaccination Clinic  Name:  Naturi Alarid    MRN: [id number] DOB: 03-01-1946  10/24/2020  Ms. Arman was observed post Covid-19 immunization for 15 minutes without incident. She was provided with Vaccine Information Sheet and instruction to access the V-Safe system.   Ms. Limburg was instructed to call 911 with any severe reactions post vaccine: Marland Kitchen Difficulty breathing  . Swelling of face and throat  . A fast heartbeat  . A bad rash all over body  . Dizziness and weakness   Immunizations Administered    Name Date Dose VIS Date Route   Pfizer COVID-19 Vaccine 10/24/2020  3:45 PM 0.3 mL 03/08/2020 Intramuscular   Manufacturer: [place]   Lot: [id number]   NDC: [address]-5

## 2021-01-18 ENCOUNTER — Other Ambulatory Visit: Payer: Self-pay | Admitting: Internal Medicine

## 2021-01-18 DIAGNOSIS — Z1231 Encounter for screening mammogram for malignant neoplasm of breast: Secondary | ICD-10-CM

## 2021-02-27 ENCOUNTER — Other Ambulatory Visit: Payer: Self-pay

## 2021-02-27 ENCOUNTER — Ambulatory Visit
Admission: RE | Admit: 2021-02-27 | Discharge: 2021-02-27 | Disposition: A | Discharge status: Home / Self Care | Payer: Medicare HMO | Source: Ambulatory Visit | Attending: Internal Medicine | Admitting: Internal Medicine

## 2021-02-27 DIAGNOSIS — Z1231 Encounter for screening mammogram for malignant neoplasm of breast: Secondary | ICD-10-CM

## 2022-01-02 IMAGING — MG MM DIGITAL SCREENING BILAT W/ TOMO AND CAD
8 series · 8 of 24 positions shown · non-contrast
Comparison: Previous exam(s).

CLINICAL DATA: Screening.

EXAM:
DIGITAL SCREENING BILATERAL MAMMOGRAM WITH TOMOSYNTHESIS AND CAD
TECHNIQUE: Bilateral screening digital craniocaudal and mediolateral oblique
mammograms were obtained. Bilateral screening digital breast
tomosynthesis was performed. The images were evaluated with
computer-aided detection.

[L MLO synth-2D]
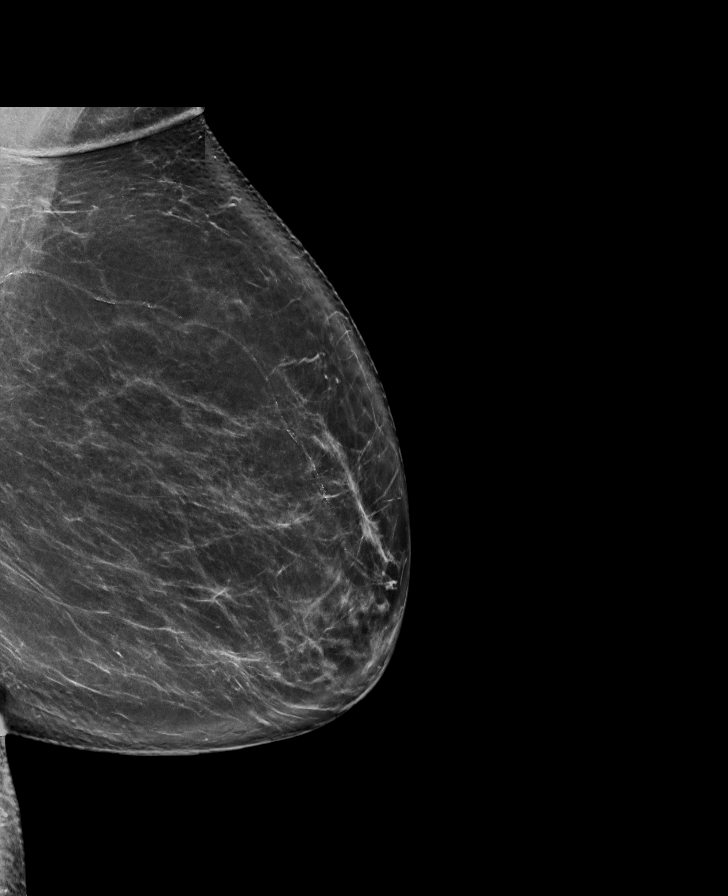

[L CC synth-2D]
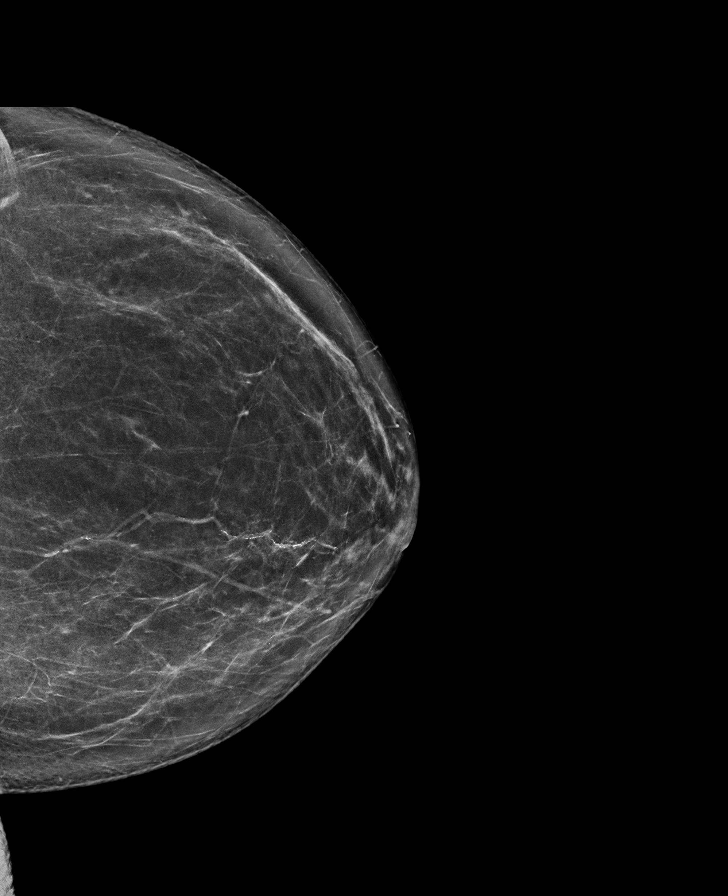

[R CC synth-2D]
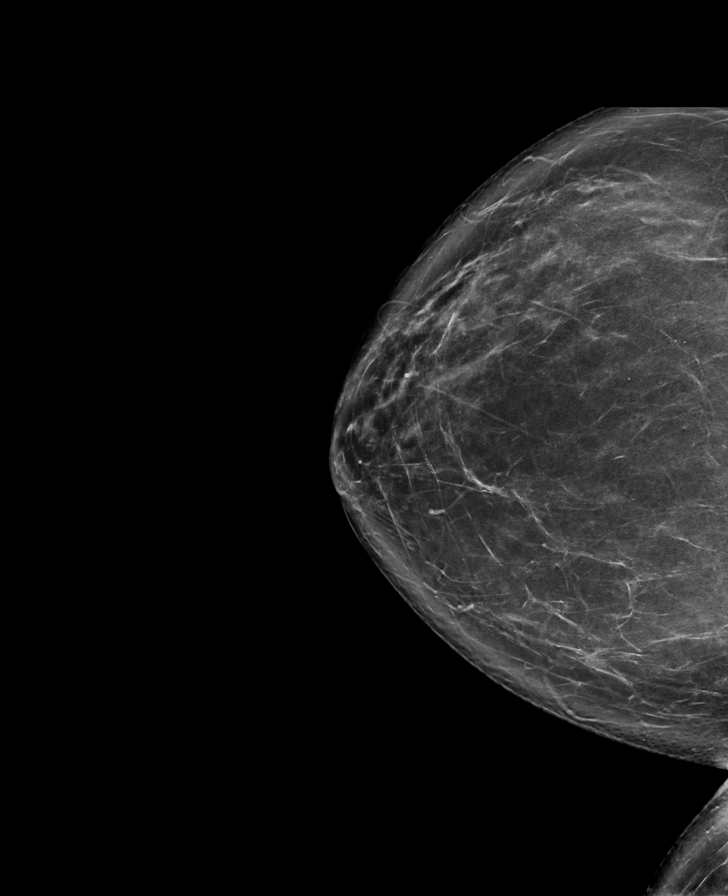

[R MLO synth-2D]
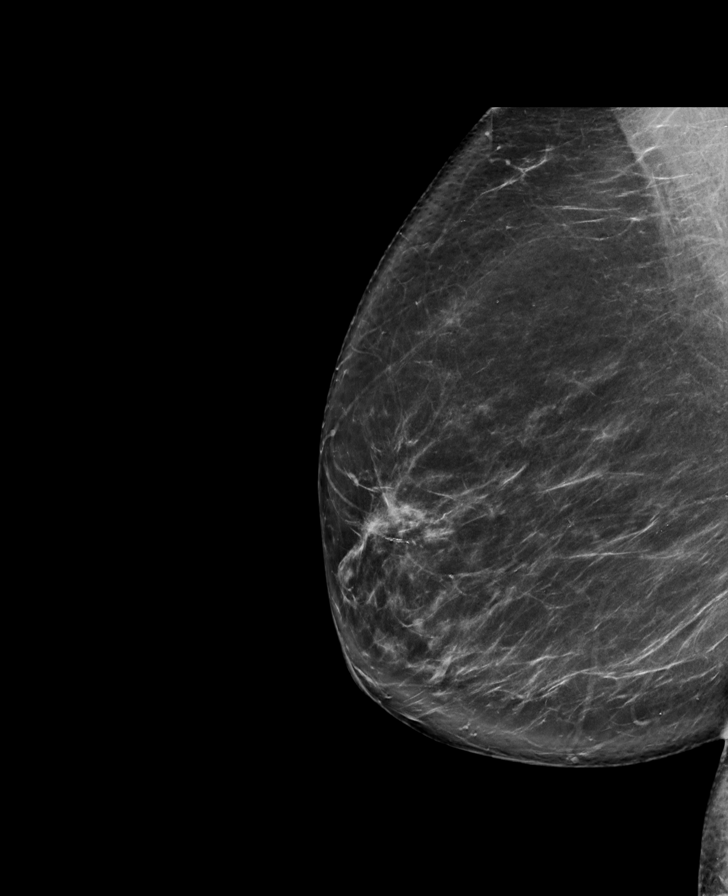

[L CC tomo · tomo slice 43/86.0]
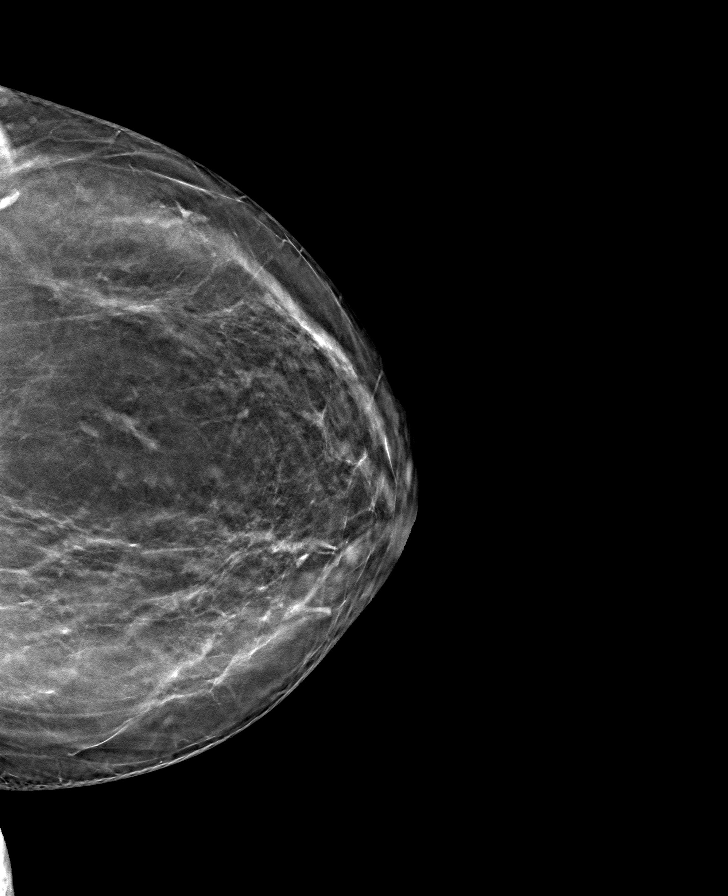

[L MLO tomo · tomo slice 47/93.0]
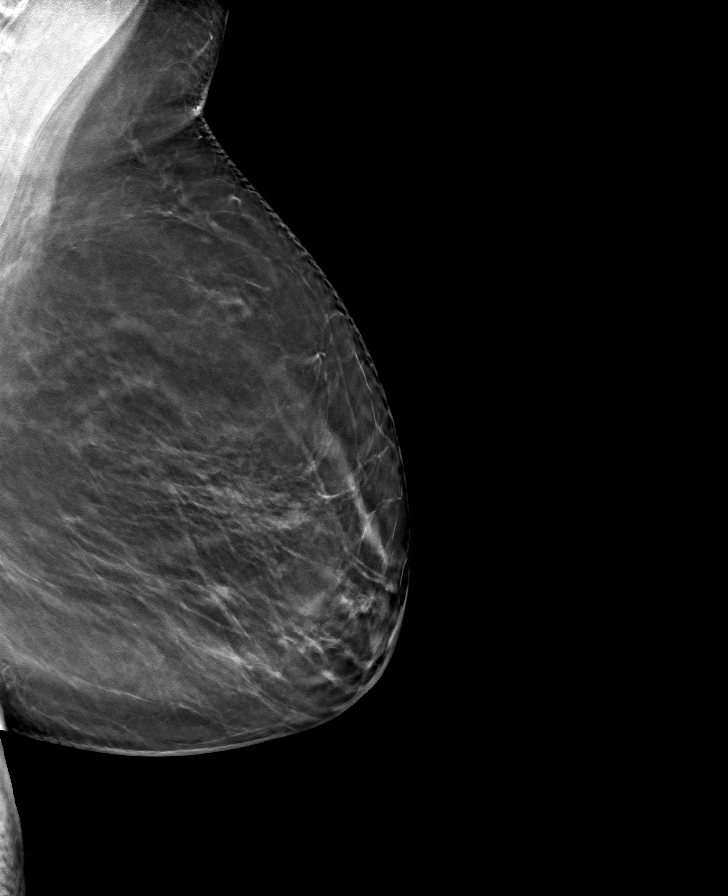

[R CC tomo · tomo slice 47/92.0]
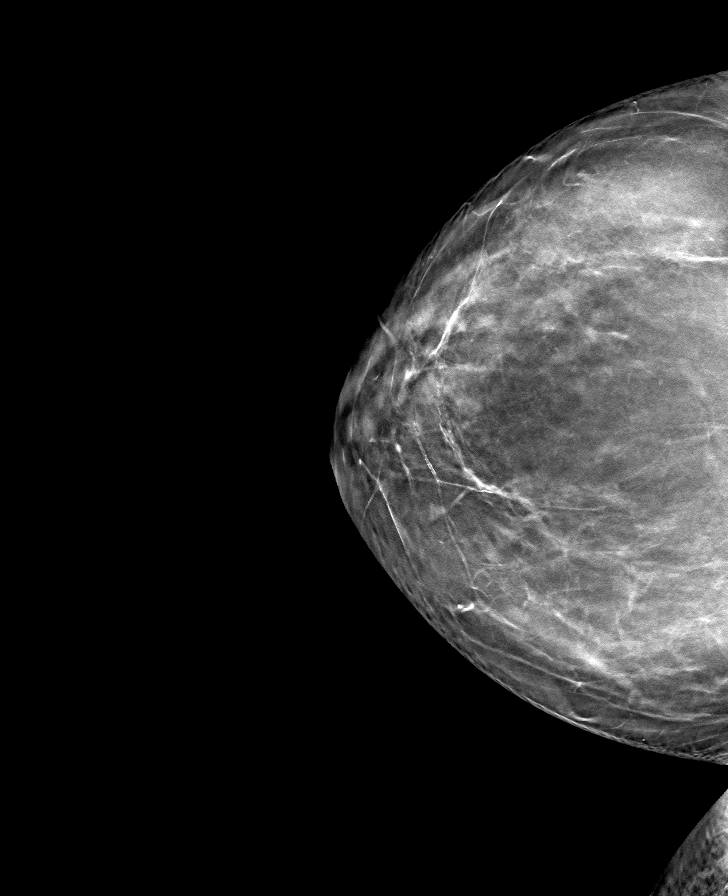

[R MLO tomo · tomo slice 48/95.0]
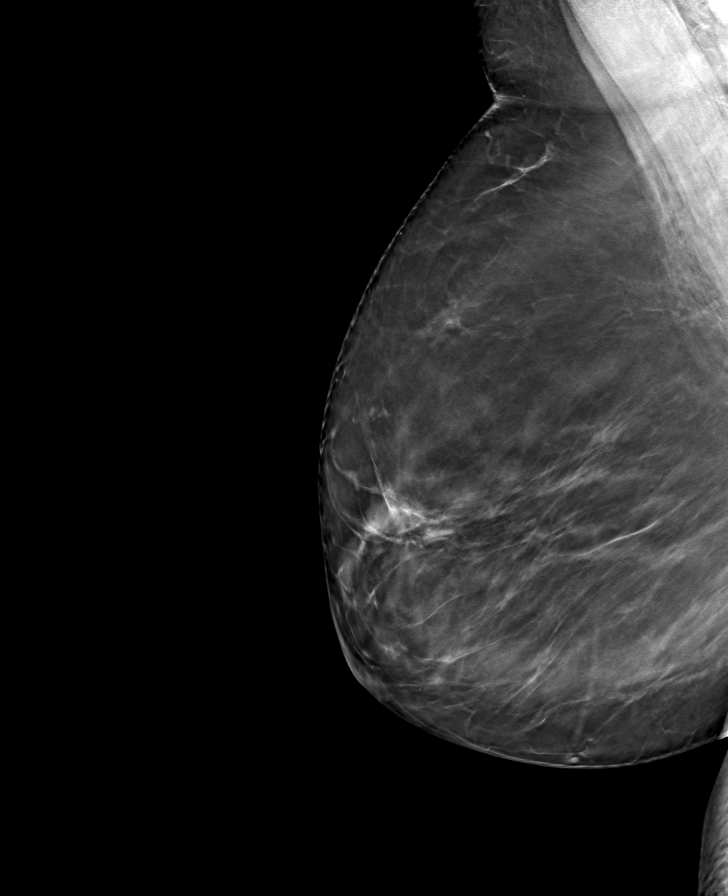

[8 of 24 positions shown; findings below may reference images not displayed]

ACR Breast Density Category b: There are scattered areas of
fibroglandular density.
FINDINGS: There are no findings suspicious for malignancy.
IMPRESSION: No mammographic evidence of malignancy. A result letter of this
screening mammogram will be mailed directly to the patient.

RECOMMENDATION:
Screening mammogram in one year. (Code:51-O-LD2)

BI-RADS CATEGORY  1: Negative.

## 2022-03-06 ENCOUNTER — Other Ambulatory Visit: Payer: Self-pay | Admitting: Internal Medicine

## 2022-03-06 DIAGNOSIS — Z1231 Encounter for screening mammogram for malignant neoplasm of breast: Secondary | ICD-10-CM

## 2022-04-24 ENCOUNTER — Ambulatory Visit: Payer: Medicare HMO

## 2022-06-19 ENCOUNTER — Ambulatory Visit
Admission: RE | Admit: 2022-06-19 | Discharge: 2022-06-19 | Disposition: A | Discharge status: Home / Self Care | Payer: Medicare HMO | Source: Ambulatory Visit | Attending: Internal Medicine | Admitting: Internal Medicine

## 2022-06-19 DIAGNOSIS — Z1231 Encounter for screening mammogram for malignant neoplasm of breast: Secondary | ICD-10-CM

## 2023-07-21 ENCOUNTER — Other Ambulatory Visit: Payer: Self-pay | Admitting: Internal Medicine

## 2023-07-21 DIAGNOSIS — Z0000 Encounter for general adult medical examination without abnormal findings: Secondary | ICD-10-CM

## 2023-07-25 ENCOUNTER — Ambulatory Visit

## 2023-07-31 ENCOUNTER — Ambulatory Visit
Admission: RE | Admit: 2023-07-31 | Discharge: 2023-07-31 | Disposition: A | Discharge status: Home / Self Care | Source: Ambulatory Visit | Attending: Internal Medicine | Admitting: Internal Medicine

## 2023-07-31 DIAGNOSIS — Z0000 Encounter for general adult medical examination without abnormal findings: Secondary | ICD-10-CM
